# Patient Record
Sex: Female | Born: 1990 | Hispanic: No | Marital: Married | Smoking: Never smoker
Health system: Southern US, Community
[De-identification: ages and names within clinical notes are randomized; demographics above are authoritative.]

## PROBLEM LIST (undated history)

## (undated) HISTORY — PX: NO PAST SURGERIES: SHX2092

---

## 2017-08-25 NOTE — L&D Delivery Note (Signed)
Delivery Note Stephanie Ali is a 27 y.o. G1P0 at [redacted]w[redacted]d admitted for IOL d/t IUFD, u/s revealed 23wk fetal size.  Labor course: cytotec 200 po>400 po> 600 pv x 2 At 0745 a non-viable female was delivered via spontaneous vaginal delivery (Presentation: undetermined). Fetus and placenta came out together by RN, I entered room immediately after. APGAR: 0,0 ; weight: pending at time of note. 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  Intrapartum complications:  None Anesthesia:  fentanyl Episiotomy: none Lacerations:  none Suture Repair: n/a Est. Blood Loss (mL): 50 Sponge and instrument count: there was no table available  Mom to stay in L&D for observation, may go home in 4 hours if stable.  Baby: with mom Placenta to path  Cheral Marker CNM, WHNP-BC 05/05/2018 8:00 AM   Cheral Marker, CNM  P [facility]        Please schedule this patient for PP visit in: 2wk mental health check d/t IUFD  Low risk pregnancy complicated by: none  Delivery mode: SVD  Anticipated Birth Control: other/unsure  PP Procedures needed: none  Schedule Integrated BH visit: yes d/t IUFD  Provider: Any provider

## 2018-02-11 ENCOUNTER — Ambulatory Visit (INDEPENDENT_AMBULATORY_CARE_PROVIDER_SITE_OTHER): Payer: BLUE CROSS/BLUE SHIELD | Admitting: Obstetrics and Gynecology

## 2018-02-11 ENCOUNTER — Encounter: Payer: Self-pay | Admitting: Obstetrics and Gynecology

## 2018-02-11 ENCOUNTER — Other Ambulatory Visit (HOSPITAL_COMMUNITY)
Admission: RE | Admit: 2018-02-11 | Discharge: 2018-02-11 | Disposition: A | Discharge status: Home / Self Care | Payer: Medicaid Other | Source: Ambulatory Visit | Attending: Obstetrics and Gynecology | Admitting: Obstetrics and Gynecology

## 2018-02-11 VITALS — BP 108/70 | HR 77 | Ht 64.0 in | Wt 138.0 lb

## 2018-02-11 DIAGNOSIS — Z3400 Encounter for supervision of normal first pregnancy, unspecified trimester: Secondary | ICD-10-CM

## 2018-02-11 DIAGNOSIS — Z3401 Encounter for supervision of normal first pregnancy, first trimester: Secondary | ICD-10-CM | POA: Diagnosis not present

## 2018-02-11 DIAGNOSIS — Z3403 Encounter for supervision of normal first pregnancy, third trimester: Secondary | ICD-10-CM

## 2018-02-11 DIAGNOSIS — Z758 Other problems related to medical facilities and other health care: Secondary | ICD-10-CM | POA: Insufficient documentation

## 2018-02-11 DIAGNOSIS — Z789 Other specified health status: Secondary | ICD-10-CM | POA: Insufficient documentation

## 2018-02-11 MED ORDER — DOXYLAMINE-PYRIDOXINE 10-10 MG PO TBEC: 2.0000 | DELAYED_RELEASE_TABLET | Freq: Every day | ORAL | 5 refills | Status: DC

## 2018-02-11 NOTE — Patient Instructions (Signed)
 Second Trimester of Pregnancy The second trimester is from week 14 through week 27 (months 4 through 6). The second trimester is often a time when you feel your best. Your body has adjusted to being pregnant, and you begin to feel better physically. Usually, morning sickness has lessened or quit completely, you may have more energy, and you may have an increase in appetite. The second trimester is also a time when the fetus is growing rapidly. At the end of the sixth month, the fetus is about 9 inches long and weighs about 1 pounds. You will likely begin to feel the baby move (quickening) between 16 and 20 weeks of pregnancy. Body changes during your second trimester Your body continues to go through many changes during your second trimester. The changes vary from woman to woman.  Your weight will continue to increase. You will notice your lower abdomen bulging out.  You may begin to get stretch marks on your hips, abdomen, and breasts.  You may develop headaches that can be relieved by medicines. The medicines should be approved by your health care provider.  You may urinate more often because the fetus is pressing on your bladder.  You may develop or continue to have heartburn as a result of your pregnancy.  You may develop constipation because certain hormones are causing the muscles that push waste through your intestines to slow down.  You may develop hemorrhoids or swollen, bulging veins (varicose veins).  You may have back pain. This is caused by: ? Weight gain. ? Pregnancy hormones that are relaxing the joints in your pelvis. ? A shift in weight and the muscles that support your balance.  Your breasts will continue to grow and they will continue to become tender.  Your gums may bleed and may be sensitive to brushing and flossing.  Dark spots or blotches (chloasma, mask of pregnancy) may develop on your face. This will likely fade after the baby is born.  A dark line from  your belly button to the pubic area (linea nigra) may appear. This will likely fade after the baby is born.  You may have changes in your hair. These can include thickening of your hair, rapid growth, and changes in texture. Some women also have hair loss during or after pregnancy, or hair that feels dry or thin. Your hair will most likely return to normal after your baby is born.  What to expect at prenatal visits During a routine prenatal visit:  You will be weighed to make sure you and the fetus are growing normally.  Your blood pressure will be taken.  Your abdomen will be measured to track your baby's growth.  The fetal heartbeat will be listened to.  Any test results from the previous visit will be discussed.  Your health care provider may ask you:  How you are feeling.  If you are feeling the baby move.  If you have had any abnormal symptoms, such as leaking fluid, bleeding, severe headaches, or abdominal cramping.  If you are using any tobacco products, including cigarettes, chewing tobacco, and electronic cigarettes.  If you have any questions.  Other tests that may be performed during your second trimester include:  Blood tests that check for: ? Low iron levels (anemia). ? High blood sugar that affects pregnant women (gestational diabetes) between 24 and 28 weeks. ? Rh antibodies. This is to check for a protein on red blood cells (Rh factor).  Urine tests to check for infections, diabetes,   or protein in the urine.  An ultrasound to confirm the proper growth and development of the baby.  An amniocentesis to check for possible genetic problems.  Fetal screens for spina bifida and Down syndrome.  HIV (human immunodeficiency virus) testing. Routine prenatal testing includes screening for HIV, unless you choose not to have this test.  Follow these instructions at home: Medicines  Follow your health care provider's instructions regarding medicine use. Specific  medicines may be either safe or unsafe to take during pregnancy.  Take a prenatal vitamin that contains at least 600 micrograms (mcg) of folic acid.  If you develop constipation, try taking a stool softener if your health care provider approves. Eating and drinking  Eat a balanced diet that includes fresh fruits and vegetables, whole grains, good sources of protein such as meat, eggs, or tofu, and low-fat dairy. Your health care provider will help you determine the amount of weight gain that is right for you.  Avoid raw meat and uncooked cheese. These carry germs that can cause birth defects in the baby.  If you have low calcium intake from food, talk to your health care provider about whether you should take a daily calcium supplement.  Limit foods that are high in fat and processed sugars, such as fried and sweet foods.  To prevent constipation: ? Drink enough fluid to keep your urine clear or pale yellow. ? Eat foods that are high in fiber, such as fresh fruits and vegetables, whole grains, and beans. Activity  Exercise only as directed by your health care provider. Most women can continue their usual exercise routine during pregnancy. Try to exercise for 30 minutes at least 5 days a week. Stop exercising if you experience uterine contractions.  Avoid heavy lifting, wear low heel shoes, and practice good posture.  A sexual relationship may be continued unless your health care provider directs you otherwise. Relieving pain and discomfort  Wear a good support bra to prevent discomfort from breast tenderness.  Take warm sitz baths to soothe any pain or discomfort caused by hemorrhoids. Use hemorrhoid cream if your health care provider approves.  Rest with your legs elevated if you have leg cramps or low back pain.  If you develop varicose veins, wear support hose. Elevate your feet for 15 minutes, 3-4 times a day. Limit salt in your diet. Prenatal Care  Write down your questions.  Take them to your prenatal visits.  Keep all your prenatal visits as told by your health care provider. This is important. Safety  Wear your seat belt at all times when driving.  Make a list of emergency phone numbers, including numbers for family, friends, the hospital, and police and fire departments. General instructions  Ask your health care provider for a referral to a local prenatal education class. Begin classes no later than the beginning of month 6 of your pregnancy.  Ask for help if you have counseling or nutritional needs during pregnancy. Your health care provider can offer advice or refer you to specialists for help with various needs.  Do not use hot tubs, steam rooms, or saunas.  Do not douche or use tampons or scented sanitary pads.  Do not cross your legs for long periods of time.  Avoid cat litter boxes and soil used by cats. These carry germs that can cause birth defects in the baby and possibly loss of the fetus by miscarriage or stillbirth.  Avoid all smoking, herbs, alcohol, and unprescribed drugs. Chemicals in these products   can affect the formation and growth of the baby.  Do not use any products that contain nicotine or tobacco, such as cigarettes and e-cigarettes. If you need help quitting, ask your health care provider.  Visit your dentist if you have not gone yet during your pregnancy. Use a soft toothbrush to brush your teeth and be gentle when you floss. Contact a health care provider if:  You have dizziness.  You have mild pelvic cramps, pelvic pressure, or nagging pain in the abdominal area.  You have persistent nausea, vomiting, or diarrhea.  You have a bad smelling vaginal discharge.  You have pain when you urinate. Get help right away if:  You have a fever.  You are leaking fluid from your vagina.  You have spotting or bleeding from your vagina.  You have severe abdominal cramping or pain.  You have rapid weight gain or weight  loss.  You have shortness of breath with chest pain.  You notice sudden or extreme swelling of your face, hands, ankles, feet, or legs.  You have not felt your baby move in over an hour.  You have severe headaches that do not go away when you take medicine.  You have vision changes. Summary  The second trimester is from week 14 through week 27 (months 4 through 6). It is also a time when the fetus is growing rapidly.  Your body goes through many changes during pregnancy. The changes vary from woman to woman.  Avoid all smoking, herbs, alcohol, and unprescribed drugs. These chemicals affect the formation and growth your baby.  Do not use any tobacco products, such as cigarettes, chewing tobacco, and e-cigarettes. If you need help quitting, ask your health care provider.  Contact your health care provider if you have any questions. Keep all prenatal visits as told by your health care provider. This is important. This information is not intended to replace advice given to you by your health care provider. Make sure you discuss any questions you have with your health care provider. Document Released: 08/05/2001 Document Revised: 09/16/2016 Document Reviewed: 09/16/2016 [employer] Interactive Patient Education  2018 [employer]   Contraception Choices Contraception, also called birth control, refers to methods or devices that prevent pregnancy. Hormonal methods Contraceptive implant A contraceptive implant is a thin, plastic tube that contains a hormone. It is inserted into the upper part of the arm. It can remain in place for up to 3 years. Progestin-only injections Progestin-only injections are injections of progestin, a synthetic form of the hormone progesterone. They are given every 3 months by a health care provider. Birth control pills Birth control pills are pills that contain hormones that prevent pregnancy. They must be taken once a day, preferably at the same time each  day. Birth control patch The birth control patch contains hormones that prevent pregnancy. It is placed on the skin and must be changed once a week for three weeks and removed on the fourth week. A prescription is needed to use this method of contraception. Vaginal ring A vaginal ring contains hormones that prevent pregnancy. It is placed in the vagina for three weeks and removed on the fourth week. After that, the process is repeated with a new ring. A prescription is needed to use this method of contraception. Emergency contraceptive Emergency contraceptives prevent pregnancy after unprotected sex. They come in pill form and can be taken up to 5 days after sex. They work best the sooner they are taken after having sex. Most emergency   contraceptives are available without a prescription. This method should not be used as your only form of birth control. Barrier methods Female condom A female condom is a thin sheath that is worn over the penis during sex. Condoms keep sperm from going inside a woman's body. They can be used with a spermicide to increase their effectiveness. They should be disposed after a single use. Female condom A female condom is a soft, loose-fitting sheath that is put into the vagina before sex. The condom keeps sperm from going inside a woman's body. They should be disposed after a single use. Diaphragm A diaphragm is a soft, dome-shaped barrier. It is inserted into the vagina before sex, along with a spermicide. The diaphragm blocks sperm from entering the uterus, and the spermicide kills sperm. A diaphragm should be left in the vagina for 6-8 hours after sex and removed within 24 hours. A diaphragm is prescribed and fitted by a health care provider. A diaphragm should be replaced every 1-2 years, after giving birth, after gaining more than 15 lb (6.8 kg), and after pelvic surgery. Cervical cap A cervical cap is a round, soft latex or plastic cup that fits over the cervix. It is  inserted into the vagina before sex, along with spermicide. It blocks sperm from entering the uterus. The cap should be left in place for 6-8 hours after sex and removed within 48 hours. A cervical cap must be prescribed and fitted by a health care provider. It should be replaced every 2 years. Sponge A sponge is a soft, circular piece of polyurethane foam with spermicide on it. The sponge helps block sperm from entering the uterus, and the spermicide kills sperm. To use it, you make it wet and then insert it into the vagina. It should be inserted before sex, left in for at least 6 hours after sex, and removed and thrown away within 30 hours. Spermicides Spermicides are chemicals that kill or block sperm from entering the cervix and uterus. They can come as a cream, jelly, suppository, foam, or tablet. A spermicide should be inserted into the vagina with an applicator at least 10-15 minutes before sex to allow time for it to work. The process must be repeated every time you have sex. Spermicides do not require a prescription. Intrauterine contraception Intrauterine device (IUD) An IUD is a T-shaped device that is put in a woman's uterus. There are two types:  Hormone IUD.This type contains progestin, a synthetic form of the hormone progesterone. This type can stay in place for 3-5 years.  Copper IUD.This type is wrapped in copper wire. It can stay in place for 10 years.  Permanent methods of contraception Female tubal ligation In this method, a woman's fallopian tubes are sealed, tied, or blocked during surgery to prevent eggs from traveling to the uterus. Hysteroscopic sterilization In this method, a small, flexible insert is placed into each fallopian tube. The inserts cause scar tissue to form in the fallopian tubes and block them, so sperm cannot reach an egg. The procedure takes about 3 months to be effective. Another form of birth control must be used during those 3 months. Female  sterilization This is a procedure to tie off the tubes that carry sperm (vasectomy). After the procedure, the man can still ejaculate fluid (semen). Natural planning methods Natural family planning In this method, a couple does not have sex on days when the woman could become pregnant. Calendar method This means keeping track of the length of each   menstrual cycle, identifying the days when pregnancy can happen, and not having sex on those days. Ovulation method In this method, a couple avoids sex during ovulation. Symptothermal method This method involves not having sex during ovulation. The woman typically checks for ovulation by watching changes in her temperature and in the consistency of cervical mucus. Post-ovulation method In this method, a couple waits to have sex until after ovulation. Summary  Contraception, also called birth control, means methods or devices that prevent pregnancy.  Hormonal methods of contraception include implants, injections, pills, patches, vaginal rings, and emergency contraceptives.  Barrier methods of contraception can include female condoms, female condoms, diaphragms, cervical caps, sponges, and spermicides.  There are two types of IUDs (intrauterine devices). An IUD can be put in a woman's uterus to prevent pregnancy for 3-5 years.  Permanent sterilization can be done through a procedure for males, females, or both.  Natural family planning methods involve not having sex on days when the woman could become pregnant. This information is not intended to replace advice given to you by your health care provider. Make sure you discuss any questions you have with your health care provider. Document Released: 08/11/2005 Document Revised: 09/13/2016 Document Reviewed: 09/13/2016 [employer] Interactive Patient Education  2018 [employer]   Breastfeeding Choosing to breastfeed is one of the best decisions you can make for yourself and your baby. A change in  hormones during pregnancy causes your breasts to make breast milk in your milk-producing glands. Hormones prevent breast milk from being released before your baby is born. They also prompt milk flow after birth. Once breastfeeding has begun, thoughts of your baby, as well as his or her sucking or crying, can stimulate the release of milk from your milk-producing glands. Benefits of breastfeeding Research shows that breastfeeding offers many health benefits for infants and mothers. It also offers a cost-free and convenient way to feed your baby. For your baby  Your first milk (colostrum) helps your baby's digestive system to function better.  Special cells in your milk (antibodies) help your baby to fight off infections.  Breastfed babies are less likely to develop asthma, allergies, obesity, or type 2 diabetes. They are also at lower risk for sudden infant death syndrome (SIDS).  Nutrients in breast milk are better able to meet your baby's needs compared to infant formula.  Breast milk improves your baby's brain development. For you  Breastfeeding helps to create a very special bond between you and your baby.  Breastfeeding is convenient. Breast milk costs nothing and is always available at the correct temperature.  Breastfeeding helps to burn calories. It helps you to lose the weight that you gained during pregnancy.  Breastfeeding makes your uterus return faster to its size before pregnancy. It also slows bleeding (lochia) after you give birth.  Breastfeeding helps to lower your risk of developing type 2 diabetes, osteoporosis, rheumatoid arthritis, cardiovascular disease, and breast, ovarian, uterine, and endometrial cancer later in life. Breastfeeding basics Starting breastfeeding  Find a comfortable place to sit or lie down, with your neck and back well-supported.  Place a pillow or a rolled-up blanket under your baby to bring him or her to the level of your breast (if you are  seated). Nursing pillows are specially designed to help support your arms and your baby while you breastfeed.  Make sure that your baby's tummy (abdomen) is facing your abdomen.  Gently massage your breast. With your fingertips, massage from the outer edges of your breast inward   toward the nipple. This encourages milk flow. If your milk flows slowly, you may need to continue this action during the feeding.  Support your breast with 4 fingers underneath and your thumb above your nipple (make the letter "C" with your hand). Make sure your fingers are well away from your nipple and your baby's mouth.  Stroke your baby's lips gently with your finger or nipple.  When your baby's mouth is open wide enough, quickly bring your baby to your breast, placing your entire nipple and as much of the areola as possible into your baby's mouth. The areola is the colored area around your nipple. ? More areola should be visible above your baby's upper lip than below the lower lip. ? Your baby's lips should be opened and extended outward (flanged) to ensure an adequate, comfortable latch. ? Your baby's tongue should be between his or her lower gum and your breast.  Make sure that your baby's mouth is correctly positioned around your nipple (latched). Your baby's lips should create a seal on your breast and be turned out (everted).  It is common for your baby to suck about 2-3 minutes in order to start the flow of breast milk. Latching Teaching your baby how to latch onto your breast properly is very important. An improper latch can cause nipple pain, decreased milk supply, and poor weight gain in your baby. Also, if your baby is not latched onto your nipple properly, he or she may swallow some air during feeding. This can make your baby fussy. Burping your baby when you switch breasts during the feeding can help to get rid of the air. However, teaching your baby to latch on properly is still the best way to prevent  fussiness from swallowing air while breastfeeding. Signs that your baby has successfully latched onto your nipple  Silent tugging or silent sucking, without causing you pain. Infant's lips should be extended outward (flanged).  Swallowing heard between every 3-4 sucks once your milk has started to flow (after your let-down milk reflex occurs).  Muscle movement above and in front of his or her ears while sucking.  Signs that your baby has not successfully latched onto your nipple  Sucking sounds or smacking sounds from your baby while breastfeeding.  Nipple pain.  If you think your baby has not latched on correctly, slip your finger into the corner of your baby's mouth to break the suction and place it between your baby's gums. Attempt to start breastfeeding again. Signs of successful breastfeeding Signs from your baby  Your baby will gradually decrease the number of sucks or will completely stop sucking.  Your baby will fall asleep.  Your baby's body will relax.  Your baby will retain a small amount of milk in his or her mouth.  Your baby will let go of your breast by himself or herself.  Signs from you  Breasts that have increased in firmness, weight, and size 1-3 hours after feeding.  Breasts that are softer immediately after breastfeeding.  Increased milk volume, as well as a change in milk consistency and color by the fifth day of breastfeeding.  Nipples that are not sore, cracked, or bleeding.  Signs that your baby is getting enough milk  Wetting at least 1-2 diapers during the first 24 hours after birth.  Wetting at least 5-6 diapers every 24 hours for the first week after birth. The urine should be clear or pale yellow by the age of 5 days.  Wetting 6-8   diapers every 24 hours as your baby continues to grow and develop.  At least 3 stools in a 24-hour period by the age of 5 days. The stool should be soft and yellow.  At least 3 stools in a 24-hour period by the  age of 7 days. The stool should be seedy and yellow.  No loss of weight greater than 10% of birth weight during the first 3 days of life.  Average weight gain of 4-7 oz (113-198 g) per week after the age of 4 days.  Consistent daily weight gain by the age of 5 days, without weight loss after the age of 2 weeks. After a feeding, your baby may spit up a small amount of milk. This is normal. Breastfeeding frequency and duration Frequent feeding will help you make more milk and can prevent sore nipples and extremely full breasts (breast engorgement). Breastfeed when you feel the need to reduce the fullness of your breasts or when your baby shows signs of hunger. This is called "breastfeeding on demand." Signs that your baby is hungry include:  Increased alertness, activity, or restlessness.  Movement of the head from side to side.  Opening of the mouth when the corner of the mouth or cheek is stroked (rooting).  Increased sucking sounds, smacking lips, cooing, sighing, or squeaking.  Hand-to-mouth movements and sucking on fingers or hands.  Fussing or crying.  Avoid introducing a pacifier to your baby in the first 4-6 weeks after your baby is born. After this time, you may choose to use a pacifier. Research has shown that pacifier use during the first year of a baby's life decreases the risk of sudden infant death syndrome (SIDS). Allow your baby to feed on each breast as long as he or she wants. When your baby unlatches or falls asleep while feeding from the first breast, offer the second breast. Because newborns are often sleepy in the first few weeks of life, you may need to awaken your baby to get him or her to feed. Breastfeeding times will vary from baby to baby. However, the following rules can serve as a guide to help you make sure that your baby is properly fed:  Newborns (babies 4 weeks of age or younger) may breastfeed every 1-3 hours.  Newborns should not go without breastfeeding  for longer than 3 hours during the day or 5 hours during the night.  You should breastfeed your baby a minimum of 8 times in a 24-hour period.  Breast milk pumping Pumping and storing breast milk allows you to make sure that your baby is exclusively fed your breast milk, even at times when you are unable to breastfeed. This is especially important if you go back to work while you are still breastfeeding, or if you are not able to be present during feedings. Your lactation consultant can help you find a method of pumping that works best for you and give you guidelines about how long it is safe to store breast milk. Caring for your breasts while you breastfeed Nipples can become dry, cracked, and sore while breastfeeding. The following recommendations can help keep your breasts moisturized and healthy:  Avoid using soap on your nipples.  Wear a supportive bra designed especially for nursing. Avoid wearing underwire-style bras or extremely tight bras (sports bras).  Air-dry your nipples for 3-4 minutes after each feeding.  Use only cotton bra pads to absorb leaked breast milk. Leaking of breast milk between feedings is normal.  Use lanolin   on your nipples after breastfeeding. Lanolin helps to maintain your skin's normal moisture barrier. Pure lanolin is not harmful (not toxic) to your baby. You may also hand express a few drops of breast milk and gently massage that milk into your nipples and allow the milk to air-dry.  In the first few weeks after giving birth, some women experience breast engorgement. Engorgement can make your breasts feel heavy, warm, and tender to the touch. Engorgement peaks within 3-5 days after you give birth. The following recommendations can help to ease engorgement:  Completely empty your breasts while breastfeeding or pumping. You may want to start by applying warm, moist heat (in the shower or with warm, water-soaked hand towels) just before feeding or pumping. This  increases circulation and helps the milk flow. If your baby does not completely empty your breasts while breastfeeding, pump any extra milk after he or she is finished.  Apply ice packs to your breasts immediately after breastfeeding or pumping, unless this is too uncomfortable for you. To do this: ? Put ice in a plastic bag. ? Place a towel between your skin and the bag. ? Leave the ice on for 20 minutes, 2-3 times a day.  Make sure that your baby is latched on and positioned properly while breastfeeding.  If engorgement persists after 48 hours of following these recommendations, contact your health care provider or a lactation consultant. Overall health care recommendations while breastfeeding  Eat 3 healthy meals and 3 snacks every day. Well-nourished mothers who are breastfeeding need an additional 450-500 calories a day. You can meet this requirement by increasing the amount of a balanced diet that you eat.  Drink enough water to keep your urine pale yellow or clear.  Rest often, relax, and continue to take your prenatal vitamins to prevent fatigue, stress, and low vitamin and mineral levels in your body (nutrient deficiencies).  Do not use any products that contain nicotine or tobacco, such as cigarettes and e-cigarettes. Your baby may be harmed by chemicals from cigarettes that pass into breast milk and exposure to secondhand smoke. If you need help quitting, ask your health care provider.  Avoid alcohol.  Do not use illegal drugs or marijuana.  Talk with your health care provider before taking any medicines. These include over-the-counter and prescription medicines as well as vitamins and herbal supplements. Some medicines that may be harmful to your baby can pass through breast milk.  It is possible to become pregnant while breastfeeding. If birth control is desired, ask your health care provider about options that will be safe while breastfeeding your baby. Where to find more  information: [employer]: www.llli.org Contact a health care provider if:  You feel like you want to stop breastfeeding or have become frustrated with breastfeeding.  Your nipples are cracked or bleeding.  Your breasts are red, tender, or warm.  You have: ? Painful breasts or nipples. ? A swollen area on either breast. ? A fever or chills. ? Nausea or vomiting. ? Drainage other than breast milk from your nipples.  Your breasts do not become full before feedings by the fifth day after you give birth.  You feel sad and depressed.  Your baby is: ? Too sleepy to eat well. ? Having trouble sleeping. ? More than 1 week old and wetting fewer than 6 diapers in a 24-hour period. ? Not gaining weight by 5 days of age.  Your baby has fewer than 3 stools in a 24-hour period.    Your baby's skin or the white parts of his or her eyes become yellow. Get help right away if:  Your baby is overly tired (lethargic) and does not want to wake up and feed.  Your baby develops an unexplained fever. Summary  Breastfeeding offers many health benefits for infant and mothers.  Try to breastfeed your infant when he or she shows early signs of hunger.  Gently tickle or stroke your baby's lips with your finger or nipple to allow the baby to open his or her mouth. Bring the baby to your breast. Make sure that much of the areola is in your baby's mouth. Offer one side and burp the baby before you offer the other side.  Talk with your health care provider or lactation consultant if you have questions or you face problems as you breastfeed. This information is not intended to replace advice given to you by your health care provider. Make sure you discuss any questions you have with your health care provider. Document Released: 08/11/2005 Document Revised: 09/12/2016 Document Reviewed: 09/12/2016 Elsevier Interactive Patient Education  2018 Elsevier Inc.  

## 2018-02-11 NOTE — Progress Notes (Signed)
  Subjective:    Stephanie Ali is a G1P0 6150w3d being seen today for her first obstetrical visit.  Her obstetrical history is significant for first pregnancy. Patient does intend to breast feed. Pregnancy history fully reviewed.  Patient reports no complaints.  Vitals:   02/11/18 0926 02/11/18 0940  BP: 108/70   Pulse: 77   Weight: 138 lb (62.6 kg)   Height:  5\' 4"  (1.626 m)    HISTORY: OB History  Gravida Para Term Preterm AB Living  1            SAB TAB Ectopic Multiple Live Births               # Outcome Date GA Lbr Len/2nd Weight Sex Delivery Anes PTL Lv  1 Current            History reviewed. No pertinent past medical history. History reviewed. No pertinent surgical history. History reviewed. No pertinent family history.   Exam    Uterus:   12-week size  Pelvic Exam:    Perineum: No Hemorrhoids, Normal Perineum   Vulva: normal   Vagina:  normal mucosa, normal discharge   pH:    Cervix: nulliparous appearance, closed and long   Adnexa: normal adnexa and no mass, fullness, tenderness   Bony Pelvis: gynecoid  System: Breast:  normal appearance, no masses or tenderness   Skin: normal coloration and turgor, no rashes    Neurologic: oriented, no focal deficits   Extremities: normal strength, tone, and muscle mass   HEENT extra ocular movement intact   Mouth/Teeth mucous membranes moist, pharynx normal without lesions and dental hygiene good   Neck supple and no masses   Cardiovascular: regular rate and rhythm   Respiratory:  chest clear, no wheezing, crepitations, rhonchi, normal symmetric air entry   Abdomen: soft, non-tender; bowel sounds normal; no masses,  no organomegaly   Urinary:       Assessment:    Pregnancy: G1P0 Patient Active Problem List   Diagnosis Date Noted  . Supervision of normal first pregnancy, antepartum 02/11/2018  . Language barrier 02/11/2018        Plan:     Initial labs drawn. Prenatal vitamins. Problem list reviewed and  updated. Genetic Screening discussed : requested panorama  Ultrasound discussed; fetal survey: requested.  Follow up in 4 weeks. 50% of 30 min visit spent on counseling and coordination of care.     Stephanie Ali 02/11/2018

## 2018-02-11 NOTE — Progress Notes (Signed)
[employer] interpreter Irven Coealad URTA

## 2018-02-13 LAB — URINE CULTURE, OB REFLEX: Organism ID, Bacteria: NO GROWTH

## 2018-02-13 LAB — CULTURE, OB URINE

## 2018-02-15 LAB — CYTOLOGY - PAP
CHLAMYDIA, DNA PROBE: NEGATIVE
Diagnosis: NEGATIVE
NEISSERIA GONORRHEA: NEGATIVE

## 2018-02-19 LAB — OBSTETRIC PANEL, INCLUDING HIV
Antibody Screen: NEGATIVE
Basophils Absolute: 0 10*3/uL (ref 0.0–0.2)
Basos: 0 %
EOS (ABSOLUTE): 0 10*3/uL (ref 0.0–0.4)
EOS: 1 %
HEMATOCRIT: 36.2 % (ref 34.0–46.6)
HEMOGLOBIN: 12.2 g/dL (ref 11.1–15.9)
HEP B S AG: NEGATIVE
HIV Screen 4th Generation wRfx: NONREACTIVE
IMMATURE GRANS (ABS): 0 10*3/uL (ref 0.0–0.1)
IMMATURE GRANULOCYTES: 0 %
LYMPHS: 28 %
Lymphocytes Absolute: 1.4 10*3/uL (ref 0.7–3.1)
MCH: 28.8 pg (ref 26.6–33.0)
MCHC: 33.7 g/dL (ref 31.5–35.7)
MCV: 86 fL (ref 79–97)
MONOS ABS: 0.4 10*3/uL (ref 0.1–0.9)
Monocytes: 8 %
Neutrophils Absolute: 3.1 10*3/uL (ref 1.4–7.0)
Neutrophils: 63 %
Platelets: 163 10*3/uL (ref 150–450)
RBC: 4.23 x10E6/uL (ref 3.77–5.28)
RDW: 14.7 % (ref 12.3–15.4)
RH TYPE: POSITIVE
RPR Ser Ql: NONREACTIVE
Rubella Antibodies, IGG: 11.6 index (ref >0.99)
WBC: 4.9 10*3/uL (ref 3.4–10.8)

## 2018-02-19 LAB — SMN1 COPY NUMBER ANALYSIS (SMA CARRIER SCREENING)

## 2018-02-19 LAB — HEMOGLOBINOPATHY EVALUATION
HGB C: 0 %
HGB S: 0 %
HGB VARIANT: 0 %
Hemoglobin A2 Quantitation: 2.5 % (ref 1.8–3.2)
Hemoglobin F Quantitation: 0 % (ref 0.0–2.0)
Hgb A: 97.5 % (ref 96.4–98.8)

## 2018-02-19 LAB — CYSTIC FIBROSIS MUTATION 97: GENE DIS ANAL CARRIER INTERP BLD/T-IMP: NOT DETECTED

## 2018-02-19 NOTE — Progress Notes (Unsigned)
Panorama results scanned. Armandina StammerJennifer Howard RN

## 2018-03-16 ENCOUNTER — Ambulatory Visit (INDEPENDENT_AMBULATORY_CARE_PROVIDER_SITE_OTHER): Payer: Medicaid Other | Admitting: Advanced Practice Midwife

## 2018-03-16 ENCOUNTER — Encounter: Payer: Medicaid Other | Admitting: Advanced Practice Midwife

## 2018-03-16 ENCOUNTER — Encounter: Payer: Self-pay | Admitting: Advanced Practice Midwife

## 2018-03-16 VITALS — BP 120/86 | HR 93 | Wt 139.0 lb

## 2018-03-16 DIAGNOSIS — Z3490 Encounter for supervision of normal pregnancy, unspecified, unspecified trimester: Secondary | ICD-10-CM

## 2018-03-16 DIAGNOSIS — Z3400 Encounter for supervision of normal first pregnancy, unspecified trimester: Secondary | ICD-10-CM

## 2018-03-16 DIAGNOSIS — Z3402 Encounter for supervision of normal first pregnancy, second trimester: Secondary | ICD-10-CM

## 2018-03-16 MED ORDER — NATACHEW 28-1 MG PO CHEW: 1.0000 | CHEWABLE_TABLET | Freq: Every day | ORAL | 8 refills | Status: DC

## 2018-03-16 NOTE — Progress Notes (Signed)
   PRENATAL VISIT NOTE  Subjective:  Stephanie Ali is a 27 y.o. G1P0 at 6231w1d being seen today for ongoing prenatal care.  She is currently monitored for the following issues for this low-risk pregnancy and has Supervision of normal first pregnancy, antepartum and Language barrier on their problem list.  Patient reports no complaints.  Contractions: Not present. Vag. Bleeding: None.  Movement: Present. Denies leaking of fluid.   The following portions of the patient's history were reviewed and updated as appropriate: allergies, current medications, past family history, past medical history, past social history, past surgical history and problem list. Problem list updated.  Objective:   Vitals:   03/16/18 1047  BP: 120/86  Pulse: 93  Weight: 139 lb (63 kg)    Fetal Status:   Fundal Height: 18 cm Movement: Present     General:  Alert, oriented and cooperative. Patient is in no acute distress.  Skin: Skin is warm and dry. No rash noted.   Cardiovascular: Normal heart rate noted  Respiratory: Normal respiratory effort, no problems with respiration noted  Abdomen: Soft, gravid, appropriate for gestational age.  Pain/Pressure: Present     Pelvic: Cervical exam deferred        Extremities: Normal range of motion.  Edema: None  Mental Status: Normal mood and affect. Normal behavior. Normal judgment and thought content.   Assessment and Plan:  Pregnancy: G1P0 at 5531w1d  1. Prenatal care, antepartum     Reviewed normal panorama results     Does not wish to know gender     Has friend with her today translating  - US MFM OB COMP + 14 WK; Future  2. Supervision of normal first pregnancy, antepartum       Reviewed warning signs and normal fetal development   Preterm labor symptoms and general obstetric precautions including but not limited to vaginal bleeding, contractions, leaking of fluid and fetal movement were reviewed in detail with the patient. Please refer to After Visit Summary for  other counseling recommendations.  [facility] (around [facility]13[facility]2018[facility]  Future Appointments  Date Time Provider Department Center  04/01/2018  8:15 AM [facility] US 4 [facility]  04/14/2018  1:15 PM Willodean RosenthalHarraway-Smith, Carolyn, MD [facility] None    Wynelle BourgeoisMarie Christon Parada, CNM

## 2018-03-16 NOTE — Patient Instructions (Signed)
Second Trimester of Pregnancy The second trimester is from week 13 through week 28, month 4 through 6. This is often the time in pregnancy that you feel your best. Often times, morning sickness has lessened or quit. You may have more energy, and you may get hungry more often. Your unborn baby (fetus) is growing rapidly. At the end of the sixth month, he or she is about 9 inches long and weighs about 1 pounds. You will likely feel the baby move (quickening) between 18 and 20 weeks of pregnancy. Follow these instructions at home:  Avoid all smoking, herbs, and alcohol. Avoid drugs not approved by your doctor.  Do not use any tobacco products, including cigarettes, chewing tobacco, and electronic cigarettes. If you need help quitting, ask your doctor. You may get counseling or other support to help you quit.  Only take medicine as told by your doctor. Some medicines are safe and some are not during pregnancy.  Exercise only as told by your doctor. Stop exercising if you start having cramps.  Eat regular, healthy meals.  Wear a good support bra if your breasts are tender.  Do not use hot tubs, steam rooms, or saunas.  Wear your seat belt when driving.  Avoid raw meat, uncooked cheese, and liter boxes and soil used by cats.  Take your prenatal vitamins.  Take 1500-2000 milligrams of calcium daily starting at the 20th week of pregnancy until you deliver your baby.  Try taking medicine that helps you poop (stool softener) as needed, and if your doctor approves. Eat more fiber by eating fresh fruit, vegetables, and whole grains. Drink enough fluids to keep your pee (urine) clear or pale yellow.  Take warm water baths (sitz baths) to soothe pain or discomfort caused by hemorrhoids. Use hemorrhoid cream if your doctor approves.  If you have puffy, bulging veins (varicose veins), wear support hose. Raise (elevate) your feet for 15 minutes, 3-4 times a day. Limit salt in your diet.  Avoid heavy  lifting, wear low heals, and sit up straight.  Rest with your legs raised if you have leg cramps or low back pain.  Visit your dentist if you have not gone during your pregnancy. Use a soft toothbrush to brush your teeth. Be gentle when you floss.  You can have sex (intercourse) unless your doctor tells you not to.  Go to your doctor visits. Get help if:  You feel dizzy.  You have mild cramps or pressure in your lower belly (abdomen).  You have a nagging pain in your belly area.  You continue to feel sick to your stomach (nauseous), throw up (vomit), or have watery poop (diarrhea).  You have bad smelling fluid coming from your vagina.  You have pain with peeing (urination). Get help right away if:  You have a fever.  You are leaking fluid from your vagina.  You have spotting or bleeding from your vagina.  You have severe belly cramping or pain.  You lose or gain weight rapidly.  You have trouble catching your breath and have chest pain.  You notice sudden or extreme puffiness (swelling) of your face, hands, ankles, feet, or legs.  You have not felt the baby move in over an hour.  You have severe headaches that do not go away with medicine.  You have vision changes. This information is not intended to replace advice given to you by your health care provider. Make sure you discuss any questions you have with your health care   provider. Document Released: 11/05/2009 Document Revised: 01/17/2016 Document Reviewed: 10/12/2012 Elsevier Interactive Patient Education  2017 Elsevier Inc.  

## 2018-03-16 NOTE — Progress Notes (Deleted)
   PRENATAL VISIT NOTE  Subjective:  Stephanie Ali is a 27 y.o. G1P0 at 5232w1d being seen today for ongoing prenatal care.  She is currently monitored for the following issues for this {Blank single:19197::"high-risk","low-risk"} pregnancy and has Supervision of normal first pregnancy, antepartum and Language barrier on their problem list.  Patient reports {sx:14538}.  Contractions: Not present. Vag. Bleeding: None.  Movement: Present. Denies leaking of fluid.   The following portions of the patient's history were reviewed and updated as appropriate: allergies, current medications, past family history, past medical history, past social history, past surgical history and problem list. Problem list updated.  Objective:   Vitals:   03/16/18 1047  BP: 120/86  Pulse: 93  Weight: 139 lb (63 kg)    Fetal Status:   Fundal Height: 18 cm Movement: Present     General:  Alert, oriented and cooperative. Patient is in no acute distress.  Skin: Skin is warm and dry. No rash noted.   Cardiovascular: Normal heart rate noted  Respiratory: Normal respiratory effort, no problems with respiration noted  Abdomen: Soft, gravid, appropriate for gestational age.  Pain/Pressure: Present     Pelvic: {Blank single:19197::"Cervical exam performed","Cervical exam deferred"}        Extremities: Normal range of motion.  Edema: None  Mental Status: Normal mood and affect. Normal behavior. Normal judgment and thought content.   Assessment and Plan:  Pregnancy: G1P0 at 5332w1d  1. Prenatal care, antepartum *** - US MFM OB COMP + 14 WK; Future  2. Supervision of normal first pregnancy, antepartum ***  {Blank single:19197::"Term","Preterm"} labor symptoms and general obstetric precautions including but not limited to vaginal bleeding, contractions, leaking of fluid and fetal movement were reviewed in detail with the patient. Please refer to After Visit Summary for other counseling recommendations.  Return in about 1  month (around 04/13/2018[facility]  Future Appointments  Date Time Provider Department Center  04/01/2018  8:15 AM [facility] US 4 [facility]  04/14/2018  1:15 PM Willodean RosenthalHarraway-Smith, Carolyn, MD [facility] None    Wynelle BourgeoisMarie Betzaida Cremeens, CNM

## 2018-03-16 NOTE — Progress Notes (Incomplete)
   PRENATAL VISIT NOTE  Subjective:  Stephanie Ali is a 27 y.o. G1P0 at [redacted]w[redacted]d being seen today for ongoing prenatal care.  She is currently monitored for the following issues for this {Blank single:19197::"high-risk","low-risk"} pregnancy and has Supervision of normal first pregnancy, antepartum and Language barrier on their problem list.  Patient reports {sx:14538}.  Contractions: Not present. Vag. Bleeding: None.  Movement: Present. Denies leaking of fluid.   The following portions of the patient's history were reviewed and updated as appropriate: allergies, current medications, past family history, past medical history, past social history, past surgical history and problem list. Problem list updated.  Objective:   Vitals:   03/16/18 1047  BP: 120/86  Pulse: 93  Weight: 139 lb (63 kg)    Fetal Status:   Fundal Height: 18 cm Movement: Present     General:  Alert, oriented and cooperative. Patient is in no acute distress.  Skin: Skin is warm and dry. No rash noted.   Cardiovascular: Normal heart rate noted  Respiratory: Normal respiratory effort, no problems with respiration noted  Abdomen: Soft, gravid, appropriate for gestational age.  Pain/Pressure: Present     Pelvic: {Blank single:19197::"Cervical exam performed","Cervical exam deferred"}        Extremities: Normal range of motion.  Edema: None  Mental Status: Normal mood and affect. Normal behavior. Normal judgment and thought content.   Assessment and Plan:  Pregnancy: G1P0 at [redacted]w[redacted]d  1. Prenatal care, antepartum *** - US MFM OB COMP + 14 WK; Future  2. Supervision of normal first pregnancy, antepartum ***  {Blank single:19197::"Term","Preterm"} labor symptoms and general obstetric precautions including but not limited to vaginal bleeding, contractions, leaking of fluid and fetal movement were reviewed in detail with the patient. Please refer to After Visit Summary for other counseling recommendations.  Return in about 1  month (around 04/13/2018) for [facility].  Future Appointments  Date Time Provider Department Center  04/01/2018  8:15 AM [facility] US 4 [facility]  04/14/2018  1:15 PM Harraway-Smith, Carolyn, MD [facility] None    Marie Williams, CNM 

## 2018-03-25 ENCOUNTER — Encounter (HOSPITAL_COMMUNITY): Payer: Self-pay

## 2018-04-01 ENCOUNTER — Other Ambulatory Visit (HOSPITAL_COMMUNITY): Payer: Self-pay | Admitting: *Deleted

## 2018-04-01 ENCOUNTER — Other Ambulatory Visit: Payer: Self-pay | Admitting: Advanced Practice Midwife

## 2018-04-01 ENCOUNTER — Ambulatory Visit (HOSPITAL_COMMUNITY)
Admission: RE | Admit: 2018-04-01 | Discharge: 2018-04-01 | Disposition: A | Discharge status: Home / Self Care | Payer: Medicaid Other | Source: Ambulatory Visit | Attending: Advanced Practice Midwife | Admitting: Advanced Practice Midwife

## 2018-04-01 DIAGNOSIS — Z363 Encounter for antenatal screening for malformations: Secondary | ICD-10-CM | POA: Diagnosis not present

## 2018-04-01 DIAGNOSIS — Z3490 Encounter for supervision of normal pregnancy, unspecified, unspecified trimester: Secondary | ICD-10-CM

## 2018-04-01 DIAGNOSIS — Z362 Encounter for other antenatal screening follow-up: Secondary | ICD-10-CM

## 2018-04-01 DIAGNOSIS — Z3A20 20 weeks gestation of pregnancy: Secondary | ICD-10-CM

## 2018-04-14 ENCOUNTER — Ambulatory Visit (INDEPENDENT_AMBULATORY_CARE_PROVIDER_SITE_OTHER): Payer: Medicaid Other | Admitting: Obstetrics & Gynecology

## 2018-04-14 VITALS — BP 129/61 | HR 89 | Wt 140.0 lb

## 2018-04-14 DIAGNOSIS — Z789 Other specified health status: Secondary | ICD-10-CM

## 2018-04-14 DIAGNOSIS — R12 Heartburn: Secondary | ICD-10-CM

## 2018-04-14 DIAGNOSIS — Z3400 Encounter for supervision of normal first pregnancy, unspecified trimester: Secondary | ICD-10-CM

## 2018-04-14 DIAGNOSIS — O26899 Other specified pregnancy related conditions, unspecified trimester: Secondary | ICD-10-CM

## 2018-04-14 MED ORDER — RANITIDINE HCL 75 MG PO TABS: 150.0000 mg | ORAL_TABLET | Freq: Two times a day (BID) | ORAL | 1 refills | Status: DC

## 2018-04-14 NOTE — Progress Notes (Signed)
   PRENATAL VISIT NOTE  Subjective:  Stephanie Ali is a 27 y.o. G1P0 at 7120w2d being seen today for ongoing prenatal care.  She is currently monitored for the following issues for this low-risk pregnancy and has Supervision of normal first pregnancy, antepartum and Language barrier on their problem list.  Patient reports heartburn.  Contractions: Not present. Vag. Bleeding: None.   . Denies leaking of fluid.   The following portions of the patient's history were reviewed and updated as appropriate: allergies, current medications, past family history, past medical history, past social history, past surgical history and problem list. Problem list updated.  Objective:   Vitals:   04/14/18 1326  BP: 129/61  Pulse: 89  Weight: 140 lb (63.5 kg)    Fetal Status:           General:  Alert, oriented and cooperative. Patient is in no acute distress.  Skin: Skin is warm and dry. No rash noted.   Cardiovascular: Normal heart rate noted  Respiratory: Normal respiratory effort, no problems with respiration noted  Abdomen: Soft, gravid, appropriate for gestational age.  Pain/Pressure: Present     Pelvic: Cervical exam deferred        Extremities: Normal range of motion.  Edema: None  Mental Status: Normal mood and affect. Normal behavior. Normal judgment and thought content.   Assessment and Plan:  Pregnancy: G1P0 at 5620w2d  1. Supervision of normal first pregnancy, antepartum Needs a f/u anatomy scan  May take Zantac OTC prn  2. Language barrier Refused interpreter  Preterm labor symptoms and general obstetric precautions including but not limited to vaginal bleeding, contractions, leaking of fluid and fetal movement were reviewed in detail with the patient. Please refer to After Visit Summary for other counseling recommendations.  Return in about 4 weeks (around 05/12/2018).  Future Appointments  Date Time Provider Department Center  05/03/2018  8:30 AM [facility] US 1 [facility]     Willodean Rosenthalarolyn Harraway-Smith, MD

## 2018-04-14 NOTE — Progress Notes (Signed)
Patient refused interpretive services.Patient request that her friend translates anything she does not understand. Patient states she understands some [place]. Armandina StammerJennifer Howard RN

## 2018-04-14 NOTE — Patient Instructions (Signed)
Heartburn During Pregnancy Heartburn is pain or discomfort in the throat or chest. It may cause a burning feeling. It happens when stomach acid moves up into the tube that carries food from your mouth to your stomach (esophagus). Heartburn is common during pregnancy. It usually goes away or gets better after giving birth. Follow these instructions at home: Eating and drinking  Do not drink alcohol while you are pregnant.  Figure out which foods and beverages make you feel worse, and avoid them.  Beverages that you may want to avoid include: ? Coffee and tea (with or without caffeine). ? Energy drinks and sports drinks. ? Bubbly (carbonated) drinks or sodas. ? Citrus fruit juices.  Foods that you may want to avoid include: ? Chocolate and cocoa. ? Peppermint and mint flavorings. ? Garlic, onions, and horseradish. ? Spicy and acidic foods. These include peppers, chili powder, curry powder, vinegar, hot sauces, and barbecue sauce. ? Citrus fruits, such as oranges, lemons, and limes. ? Tomato-based foods, such as red sauce, chili, and salsa. ? Fried and fatty foods, such as donuts, french fries, potato chips, and high-fat dressings. ? High-fat meats, such as hot dogs, cold cuts, sausage, ham, and bacon. ? High-fat dairy items, such as whole milk, butter, and cheese.  Eat small meals often, instead of large meals.  Avoid drinking a lot of liquid with your meals.  Avoid eating meals during the 2-3 hours before you go to bed.  Avoid lying down right after you eat.  Do not exercise right after you eat. Medicines  Take over-the-counter and prescription medicines only as told by your doctor.  Do not take aspirin, ibuprofen, or other NSAIDs unless your doctor tells you to do that.  Your doctor may tell you to avoid medicines that have sodium bicarbonate in them. General instructions  If told, raise the head of your bed about 6 inches (15 cm). You can do this by putting blocks under  the legs. Sleeping with more pillows does not help with heartburn.  Do not use any products that contain nicotine or tobacco, such as cigarettes and e-cigarettes. If you need help quitting, ask your doctor.  Wear loose-fitting clothing.  Try to lower your stress, such as with yoga or meditation. If you need help, ask your doctor.  Stay at a healthy weight. If you are overweight, work with your doctor to safely lose weight.  Keep all follow-up visits as told by your doctor. This is important. Contact a doctor if:  You get new symptoms.  Your symptoms do not get better with treatment.  You have weight loss and you do not know why.  You have trouble swallowing.  You make loud sounds when you breathe (wheeze).  You have a cough that does not go away.  You have heartburn often for more than 2 weeks.  You feel sick to your stomach (nauseous), and this does not get better with treatment.  You are throwing up (vomiting), and this does not get better with treatment.  You have pain in your belly (abdomen). Get help right away if:  You have very bad chest pain that spreads to your arm, neck, or jaw.  You feel sweaty, dizzy, or light-headed.  You have trouble breathing.  You have pain when swallowing.  You throw up and your throw-up looks like blood or coffee grounds.  Your poop (stool) is bloody or black. This information is not intended to replace advice given to you by your health care provider.   Make sure you discuss any questions you have with your health care provider. Document Released: 09/13/2010 Document Revised: 04/28/2016 Document Reviewed: 04/28/2016 Elsevier Interactive Patient Education  2017 Elsevier Inc.  

## 2018-05-03 ENCOUNTER — Ambulatory Visit (INDEPENDENT_AMBULATORY_CARE_PROVIDER_SITE_OTHER): Payer: Medicaid Other | Admitting: Family Medicine

## 2018-05-03 ENCOUNTER — Ambulatory Visit (HOSPITAL_BASED_OUTPATIENT_CLINIC_OR_DEPARTMENT_OTHER)
Admission: RE | Admit: 2018-05-03 | Discharge: 2018-05-03 | Disposition: A | Discharge status: Home / Self Care | Payer: Medicaid Other | Source: Ambulatory Visit | Attending: Obstetrics & Gynecology | Admitting: Obstetrics & Gynecology

## 2018-05-03 VITALS — BP 140/80 | HR 91 | Wt 140.0 lb

## 2018-05-03 DIAGNOSIS — O364XX1 Maternal care for intrauterine death, fetus 1: Secondary | ICD-10-CM

## 2018-05-03 DIAGNOSIS — Z3A25 25 weeks gestation of pregnancy: Secondary | ICD-10-CM

## 2018-05-03 DIAGNOSIS — Z362 Encounter for other antenatal screening follow-up: Secondary | ICD-10-CM | POA: Diagnosis not present

## 2018-05-03 DIAGNOSIS — O364XX0 Maternal care for intrauterine death, not applicable or unspecified: Secondary | ICD-10-CM

## 2018-05-03 NOTE — Progress Notes (Signed)
Patient reports no movement for one week. Armandina Stammer RN

## 2018-05-03 NOTE — Progress Notes (Signed)
   PRENATAL VISIT NOTE  Subjective:  Stephanie Ali is a 27 y.o. G1P0 at [redacted]w[redacted]d being seen today for ongoing prenatal care.  She is currently monitored for the following issues for this low-risk pregnancy and has Supervision of normal first pregnancy, antepartum and Language barrier on their problem list.  Patient reports no fetal movement x1 week.  Contractions: Not present. Vag. Bleeding: None.  Movement: Absent. Denies leaking of fluid.   The following portions of the patient's history were reviewed and updated as appropriate: allergies, current medications, past family history, past medical history, past social history, past surgical history and problem list. Problem list updated.  Objective:   Vitals:   05/03/18 0944  BP: 140/80  Pulse: 91  Weight: 140 lb (63.5 kg)    Fetal Status:     Movement: Absent     General:  Alert, oriented and cooperative. Patient is in no acute distress.  Skin: Skin is warm and dry. No rash noted.   Cardiovascular: Normal heart rate noted  Respiratory: Normal respiratory effort, no problems with respiration noted  Abdomen: Soft, gravid, appropriate for gestational age.  Pain/Pressure: Present     Pelvic: Cervical exam deferred        Extremities: Normal range of motion.     Mental Status: Normal mood and affect. Normal behavior. Normal judgment and thought content.   Assessment and Plan:  Pregnancy: G1P0 at [redacted]w[redacted]d  1. IUFD at 20 weeks or more of gestation dx'd on [place] at MFM. Pt counseled in her language by Dr Judeth Cornfield. Pt requested that I check - US done by Victorino Dike, Charity fundraiser. No cardiac activity. Pt to discuss with husband, who is in [place], and come up with plan.  Preterm labor symptoms and general obstetric precautions including but not limited to vaginal bleeding, contractions, leaking of fluid and fetal movement were reviewed in detail with the patient. Please refer to After Visit Summary for other counseling recommendations.  No follow-ups on  file.  Future Appointments  Date Time Provider Department Center  05/12/2018  3:00 PM Willodean Rosenthal, MD [facility] None    Levie Heritage, DO

## 2018-05-04 ENCOUNTER — Encounter (HOSPITAL_COMMUNITY): Payer: Self-pay

## 2018-05-04 ENCOUNTER — Inpatient Hospital Stay (HOSPITAL_COMMUNITY)
Admission: AD | Admit: 2018-05-04 | Discharge: 2018-05-06 | DRG: 807 | Disposition: A | Discharge status: Home / Self Care | Payer: Medicaid Other | Attending: Obstetrics and Gynecology | Admitting: Obstetrics and Gynecology

## 2018-05-04 ENCOUNTER — Inpatient Hospital Stay (HOSPITAL_COMMUNITY): Admission: RE | Admit: 2018-05-04 | Payer: Medicaid Other | Source: Ambulatory Visit

## 2018-05-04 DIAGNOSIS — O364XX0 Maternal care for intrauterine death, not applicable or unspecified: Secondary | ICD-10-CM | POA: Diagnosis present

## 2018-05-04 DIAGNOSIS — Z3A25 25 weeks gestation of pregnancy: Secondary | ICD-10-CM

## 2018-05-04 DIAGNOSIS — Z3400 Encounter for supervision of normal first pregnancy, unspecified trimester: Secondary | ICD-10-CM

## 2018-05-04 DIAGNOSIS — R12 Heartburn: Secondary | ICD-10-CM

## 2018-05-04 DIAGNOSIS — O26899 Other specified pregnancy related conditions, unspecified trimester: Secondary | ICD-10-CM

## 2018-05-04 DIAGNOSIS — Z603 Acculturation difficulty: Secondary | ICD-10-CM | POA: Diagnosis present

## 2018-05-04 DIAGNOSIS — Z789 Other specified health status: Secondary | ICD-10-CM | POA: Diagnosis present

## 2018-05-04 LAB — TYPE AND SCREEN
ABO/RH(D): B POS
Antibody Screen: NEGATIVE

## 2018-05-04 LAB — CBC WITH DIFFERENTIAL/PLATELET
Basophils Absolute: 0 10*3/uL (ref 0.0–0.1)
Basophils Relative: 0 %
EOS ABS: 0.1 10*3/uL (ref 0.0–0.7)
Eosinophils Relative: 1 %
HCT: 35 % — ABNORMAL LOW (ref 36.0–46.0)
HEMOGLOBIN: 12.2 g/dL (ref 12.0–15.0)
LYMPHS ABS: 1.7 10*3/uL (ref 0.7–4.0)
LYMPHS PCT: 25 %
MCH: 30.9 pg (ref 26.0–34.0)
MCHC: 34.9 g/dL (ref 30.0–36.0)
MCV: 88.6 fL (ref 78.0–100.0)
Monocytes Absolute: 0.4 10*3/uL (ref 0.1–1.0)
Monocytes Relative: 5 %
NEUTROS PCT: 69 %
Neutro Abs: 4.6 10*3/uL (ref 1.7–7.7)
Platelets: 158 10*3/uL (ref 150–400)
RBC: 3.95 MIL/uL (ref 3.87–5.11)
RDW: 12.7 % (ref 11.5–15.5)
WBC: 6.7 10*3/uL (ref 4.0–10.5)

## 2018-05-04 LAB — HEMOGLOBIN A1C
HEMOGLOBIN A1C: 4.6 % — AB (ref 4.8–5.6)
MEAN PLASMA GLUCOSE: 85.32 mg/dL

## 2018-05-04 LAB — FIBRINOGEN: Fibrinogen: 616 mg/dL — ABNORMAL HIGH (ref 210–475)

## 2018-05-04 LAB — KLEIHAUER-BETKE STAIN
# Vials RhIg: 1
Fetal Cells %: 0 %
Quantitation Fetal Hemoglobin: 0 mL

## 2018-05-04 LAB — SAVE SMEAR

## 2018-05-04 LAB — PROTIME-INR
INR: 1.1
PROTHROMBIN TIME: 14.1 s (ref 11.4–15.2)

## 2018-05-04 LAB — PLATELET COUNT: Platelets: 180 10*3/uL (ref 150–400)

## 2018-05-04 LAB — ABO/RH: ABO/RH(D): B POS

## 2018-05-04 LAB — APTT: aPTT: 34 seconds (ref 24–36)

## 2018-05-04 MED ORDER — OXYTOCIN 40 UNITS IN LACTATED RINGERS INFUSION - SIMPLE MED: 2.5000 [IU]/h | INTRAVENOUS | Status: DC

## 2018-05-04 MED ORDER — PHENYLEPHRINE 40 MCG/ML (10ML) SYRINGE FOR IV PUSH (FOR BLOOD PRESSURE SUPPORT)
80.0000 ug | PREFILLED_SYRINGE | INTRAVENOUS | Status: DC | PRN
  Filled 2018-05-04: qty 5
  Filled 2018-05-04: qty 10

## 2018-05-04 MED ORDER — OXYCODONE-ACETAMINOPHEN 5-325 MG PO TABS: 1.0000 | ORAL_TABLET | ORAL | Status: DC | PRN

## 2018-05-04 MED ORDER — OXYCODONE-ACETAMINOPHEN 5-325 MG PO TABS: 2.0000 | ORAL_TABLET | ORAL | Status: DC | PRN

## 2018-05-04 MED ORDER — LIDOCAINE HCL (PF) 1 % IJ SOLN
30.0000 mL | INTRAMUSCULAR | Status: DC | PRN
  Filled 2018-05-04: qty 30

## 2018-05-04 MED ORDER — LACTATED RINGERS IV SOLN: 500.0000 mL | Freq: Once | INTRAVENOUS | Status: DC

## 2018-05-04 MED ORDER — OXYTOCIN BOLUS FROM INFUSION: 500.0000 mL | Freq: Once | INTRAVENOUS | Status: DC

## 2018-05-04 MED ORDER — ONDANSETRON HCL 4 MG/2ML IJ SOLN
4.0000 mg | Freq: Four times a day (QID) | INTRAMUSCULAR | Status: DC | PRN
  Administered 2018-05-05: 4 mg via INTRAVENOUS
  Filled 2018-05-04: qty 2

## 2018-05-04 MED ORDER — MISOPROSTOL 200 MCG PO TABS
200.0000 ug | ORAL_TABLET | Freq: Once | ORAL | Status: AC
  Administered 2018-05-04: 200 ug via ORAL
  Filled 2018-05-04: qty 1

## 2018-05-04 MED ORDER — FENTANYL CITRATE (PF) 100 MCG/2ML IJ SOLN
100.0000 ug | INTRAMUSCULAR | Status: DC | PRN
  Administered 2018-05-04 – 2018-05-05 (×3): 100 ug via INTRAVENOUS
  Filled 2018-05-04 (×4): qty 2

## 2018-05-04 MED ORDER — EPHEDRINE 5 MG/ML INJ
10.0000 mg | INTRAVENOUS | Status: DC | PRN
  Filled 2018-05-04: qty 2

## 2018-05-04 MED ORDER — MISOPROSTOL 200 MCG PO TABS
400.0000 ug | ORAL_TABLET | Freq: Once | ORAL | Status: AC
  Administered 2018-05-04: 400 ug via ORAL
  Filled 2018-05-04: qty 2

## 2018-05-04 MED ORDER — DIPHENHYDRAMINE HCL 50 MG/ML IJ SOLN: 12.5000 mg | INTRAMUSCULAR | Status: DC | PRN

## 2018-05-04 MED ORDER — PHENYLEPHRINE 40 MCG/ML (10ML) SYRINGE FOR IV PUSH (FOR BLOOD PRESSURE SUPPORT)
80.0000 ug | PREFILLED_SYRINGE | INTRAVENOUS | Status: DC | PRN
  Filled 2018-05-04: qty 5

## 2018-05-04 MED ORDER — LACTATED RINGERS IV SOLN
INTRAVENOUS | Status: DC
  Administered 2018-05-04: 12:00:00 via INTRAVENOUS

## 2018-05-04 MED ORDER — SOD CITRATE-CITRIC ACID 500-334 MG/5ML PO SOLN: 30.0000 mL | ORAL | Status: DC | PRN

## 2018-05-04 MED ORDER — ACETAMINOPHEN 325 MG PO TABS: 650.0000 mg | ORAL_TABLET | ORAL | Status: DC | PRN

## 2018-05-04 MED ORDER — FENTANYL 2.5 MCG/ML BUPIVACAINE 1/10 % EPIDURAL INFUSION (WH - ANES)
14.0000 mL/h | INTRAMUSCULAR | Status: DC | PRN
  Filled 2018-05-04: qty 100

## 2018-05-04 MED ORDER — LACTATED RINGERS IV SOLN
500.0000 mL | INTRAVENOUS | Status: DC | PRN
  Administered 2018-05-05: 1000 mL via INTRAVENOUS

## 2018-05-04 MED ORDER — MISOPROSTOL 200 MCG PO TABS
600.0000 ug | ORAL_TABLET | Freq: Once | ORAL | Status: AC
  Administered 2018-05-04: 600 ug via VAGINAL
  Filled 2018-05-04: qty 3

## 2018-05-04 NOTE — Progress Notes (Signed)
Labor Progress Note Stephanie Ali is a 27 y.o. G1P0 at [redacted]w[redacted]d presented for induction of labor 2/2 IUFD S: Contractions feeling more intense, requesting pain medicine.  Also has had 1 episode diarrhea.   O:  BP 105/64   Pulse 83   Temp 99 F (37.2 C) (Axillary)   Resp 16   Ht 5\' 4"  (1.626 m)   Wt 63.5 kg   LMP 11/09/2017 (Approximate)   BMI 24.03 kg/m  EFM: IUFD  CVE:  Internal Os closed, thick. -2 station   A&P: 27 y.o. G1P0 [redacted]w[redacted]d IOL 2/2 IUFD.  #Labor: s/p PO 200 and 400 cytotec x2, placed 600 intravaginal. Recheck in 4 hrs.  #Pain: IV fentanyl #FWB: IUFD. Bedside US vertex #GBS not done  Denzil Hughes, MD 10:34 PM

## 2018-05-04 NOTE — H&P (Signed)
OBSTETRIC ADMISSION HISTORY AND PHYSICAL  Stephanie Ali is a 27 y.o. female G1P0 with IUP at [redacted]w[redacted]d by L/20 presenting for induction of labor IUFD. Patient was seen in clinic yesterday and reported no fetal movement for the past week. She had an U/S with MFM yesterday to follow-up her anatomy scan, and it was at that time that IUFD was complete and fetal biometry was consistent with [redacted] weeks gestation. Patient declined amniocentesis. Husband lives in [place].   She received her prenatal care at [facility].  Support person in labor: Sister and Mother   Ultrasounds .  20w3: anatomy U/S, normal scan including fluid levels, limited views od fetal spine and aortic and ductal arches . 25w0: Returned for U/S to follow-up anatomy scan but no cardiac activity   Prenatal History/Complications: . None  Past Medical History: History reviewed. No pertinent past medical history.  Past Surgical History: Past Surgical History:  Procedure Laterality Date  . NO PAST SURGERIES      Obstetrical History: OB History    Gravida  1   Para      Term      Preterm      AB      Living        SAB      TAB      Ectopic      Multiple      Live Births              Social History: Social History   Socioeconomic History  . Marital status: Married    Spouse name: Not on file  . Number of children: Not on file  . Years of education: Not on file  . Highest education level: Not on file  Occupational History  . Not on file  Social Needs  . Financial resource strain: Not on file  . Food insecurity:    Worry: Not on file    Inability: Not on file  . Transportation needs:    Medical: Not on file    Non-medical: Not on file  Tobacco Use  . Smoking status: Never Smoker  . Smokeless tobacco: Never Used  Substance and Sexual Activity  . Alcohol use: Never    Frequency: Never  . Drug use: Never  . Sexual activity: Yes    Birth control/protection: None  Lifestyle  . Physical activity:     Days per week: Not on file    Minutes per session: Not on file  . Stress: Not on file  Relationships  . Social connections:    Talks on phone: Not on file    Gets together: Not on file    Attends religious service: Not on file    Active member of club or organization: Not on file    Attends meetings of clubs or organizations: Not on file    Relationship status: Not on file  Other Topics Concern  . Not on file  Social History Narrative  . Not on file    Family History: History reviewed. No pertinent family history.  Allergies: No Known Allergies  Medications Prior to Admission  Medication Sig Dispense Refill Last Dose  . ranitidine (ZANTAC) 75 MG tablet Take 2 tablets (150 mg total) by mouth 2 (two) times daily. (Patient taking differently: Take 75 mg by mouth daily. ) 60 tablet 1 05/03/2018 at Unknown time  . Doxylamine-Pyridoxine (DICLEGIS) 10-10 MG TBEC Take 2 tablets by mouth at bedtime. If symptoms persist, add one tablet in the morning  and one in the afternoon (Patient not taking: Reported on 03/16/2018) 100 tablet 5 Not Taking  . Prenatal Vit-Fe Fum-Fe Bisg-FA (NATACHEW) 28-1 MG CHEW Chew 1 tablet by mouth daily. (Patient taking differently: Chew 1 tablet by mouth every other day. ) 30 tablet 8 05/02/2018     Review of Systems  All systems reviewed and negative except as stated in HPI  Blood pressure 111/63, pulse 87, temperature 98.4 F (36.9 C), temperature source Oral, resp. rate 18, height 5\' 4"  (1.626 m), weight 63.5 kg, last menstrual period 11/09/2017. General appearance: well-appearing, NAD, occasionally tearful Lungs: no respiratory distress Heart: regular rate  Abdomen: soft, non-tender; gravid b Pelvic: deferred Extremities: no sign of DVT Presentation: deferred    Prenatal labs: ABO, Rh: B/Positive/-- (06/20 1045) Antibody: Negative (06/20 1045) Rubella: 11.60 (06/20 1045) RPR: Non Reactive (06/20 1045)  HBsAg: Negative (06/20 1045)  HIV: Non Reactive  (06/20 1045)  GBS:   n/a Glucola: n/a Genetic screening:  declined  Prenatal Transfer Tool  Maternal Diabetes: No Genetic Screening: Declined Maternal Ultrasounds/Referrals: Abnormal:  Findings:   Other: as above Fetal Ultrasounds or other Referrals:  None Maternal Substance Abuse:  No Significant Maternal Medications:  None Significant Maternal Lab Results: None  No results found for this or any previous visit (from the past 24 hour(s)).  Patient Active Problem List   Diagnosis Date Noted  . IUFD at 20 weeks or more of gestation 05/04/2018  . Supervision of normal first pregnancy, antepartum 02/11/2018  . Language barrier 02/11/2018    Assessment/Plan:  Stephanie Ali is a 39 y.o. G1P0 at [redacted]w[redacted]d here for IOL for IUFD presumed to be around 22 weeks based on U/S yesterday. Will induce labor with oral cytotec and plan to increase dose as needed. Family prefers not to have any testing or autopsy and wishes to have funeral services after delivery. Process for induction of labor, pain control options discussed with use of video interpreter.   Cristal Deer. Earlene Plater, DO OB/GYN Fellow

## 2018-05-05 ENCOUNTER — Inpatient Hospital Stay (HOSPITAL_COMMUNITY): Payer: Medicaid Other | Admitting: Anesthesiology

## 2018-05-05 DIAGNOSIS — Z3A25 25 weeks gestation of pregnancy: Secondary | ICD-10-CM

## 2018-05-05 DIAGNOSIS — O364XX0 Maternal care for intrauterine death, not applicable or unspecified: Secondary | ICD-10-CM

## 2018-05-05 LAB — RPR: RPR: NONREACTIVE

## 2018-05-05 MED ORDER — IBUPROFEN 600 MG PO TABS: 600.0000 mg | ORAL_TABLET | Freq: Four times a day (QID) | ORAL | Status: DC

## 2018-05-05 MED ORDER — SIMETHICONE 80 MG PO CHEW: 80.0000 mg | CHEWABLE_TABLET | ORAL | Status: DC | PRN

## 2018-05-05 MED ORDER — DIPHENHYDRAMINE HCL 25 MG PO CAPS: 25.0000 mg | ORAL_CAPSULE | Freq: Four times a day (QID) | ORAL | Status: DC | PRN

## 2018-05-05 MED ORDER — BENZOCAINE-MENTHOL 20-0.5 % EX AERO: 1.0000 "application " | INHALATION_SPRAY | CUTANEOUS | Status: DC | PRN

## 2018-05-05 MED ORDER — PRENATAL MULTIVITAMIN CH
1.0000 | ORAL_TABLET | Freq: Every day | ORAL | Status: DC
  Administered 2018-05-06: 1 via ORAL
  Filled 2018-05-05: qty 1

## 2018-05-05 MED ORDER — SENNOSIDES-DOCUSATE SODIUM 8.6-50 MG PO TABS
2.0000 | ORAL_TABLET | ORAL | Status: DC
  Administered 2018-05-05: 2 via ORAL
  Filled 2018-05-05: qty 2

## 2018-05-05 MED ORDER — IBUPROFEN 600 MG PO TABS
600.0000 mg | ORAL_TABLET | Freq: Four times a day (QID) | ORAL | Status: DC
  Administered 2018-05-05 – 2018-05-06 (×4): 600 mg via ORAL
  Filled 2018-05-05 (×4): qty 1

## 2018-05-05 MED ORDER — ONDANSETRON HCL 4 MG PO TABS: 4.0000 mg | ORAL_TABLET | ORAL | Status: DC | PRN

## 2018-05-05 MED ORDER — DIBUCAINE 1 % RE OINT: 1.0000 "application " | TOPICAL_OINTMENT | RECTAL | Status: DC | PRN

## 2018-05-05 MED ORDER — ACETAMINOPHEN 325 MG PO TABS: 650.0000 mg | ORAL_TABLET | ORAL | Status: DC | PRN

## 2018-05-05 MED ORDER — COCONUT OIL OIL: 1.0000 "application " | TOPICAL_OIL | Status: DC | PRN

## 2018-05-05 MED ORDER — WITCH HAZEL-GLYCERIN EX PADS: 1.0000 "application " | MEDICATED_PAD | CUTANEOUS | Status: DC | PRN

## 2018-05-05 MED ORDER — MISOPROSTOL 200 MCG PO TABS
600.0000 ug | ORAL_TABLET | Freq: Once | ORAL | Status: AC
  Administered 2018-05-05: 600 ug via VAGINAL
  Filled 2018-05-05: qty 3

## 2018-05-05 MED ORDER — ZOLPIDEM TARTRATE 5 MG PO TABS: 5.0000 mg | ORAL_TABLET | Freq: Every evening | ORAL | Status: DC | PRN

## 2018-05-05 MED ORDER — IBUPROFEN 600 MG PO TABS
600.0000 mg | ORAL_TABLET | Freq: Four times a day (QID) | ORAL | Status: DC | PRN
  Administered 2018-05-05: 600 mg via ORAL
  Filled 2018-05-05: qty 1

## 2018-05-05 MED ORDER — ONDANSETRON HCL 4 MG/2ML IJ SOLN: 4.0000 mg | INTRAMUSCULAR | Status: DC | PRN

## 2018-05-05 NOTE — Progress Notes (Signed)
Labor Progress Note Stephanie Ali is a 27 y.o. G1P0 at [redacted]w[redacted]d presented for induction of labor2/2IUFD S: contractions ~q69min. Sleeping on and off  O:  BP 113/61   Pulse 66   Temp 98.2 F (36.8 C) (Oral)   Resp 18   Ht 5\' 4"  (1.626 m)   Wt 63.5 kg   LMP 11/09/2017 (Approximate)   BMI 24.03 kg/m  EFM: none  CVE: Dilation: 2.5 Effacement (%): 70 Station: 0 Presentation: Vertex Exam by:: Josel Keo, K   A&P: 27 y.o. G1P0 [redacted]w[redacted]d w/ IOL 2/2 IUFD #Labor: Progressing well. s/p PO 200 and 400 cytotec x2,  600 intravaginal x2. Consider redosing cytotec in 1 hr #Pain:IV fentanyl #FWB:IUFD #GBSnot done  Denzil Hughes, MD 6:29 AM

## 2018-05-05 NOTE — Progress Notes (Signed)
Labor Progress Note Brooklen Bruyere is a 27 y.o. G1P0 at [redacted]w[redacted]d presented for induction of labor 2/2 IUFD S: Contractions q5 min, managing w/ fentanyl, requesting another dose now. Afebrile. Notably emotional.  O:  BP (!) 113/59   Pulse 86   Temp 99 F (37.2 C) (Axillary)   Resp 16   Ht 5\' 4"  (1.626 m)   Wt 63.5 kg   LMP 11/09/2017 (Approximate)   BMI 24.03 kg/m  EFM: none  CVE:  1/50/0   A&P: 27 y.o. G1P0 [redacted]w[redacted]d w/ IL 2/2 IUFD #Labor: Progressing well. s/p PO 200 and 400 cytotec x2,  600 intravaginal x2. Recheck in 4 hrs.  #Pain: IV fentanyl #FWB: IUFD #GBS not done  Denzil Hughes, MD 3:07 AM

## 2018-05-05 NOTE — Progress Notes (Addendum)
[facility] [place] [facility] [place] [address] [place]  [address] [phone]  Garry Heater will pick infant up.  He is the brother in law.

## 2018-05-05 NOTE — Progress Notes (Signed)
IV ok to remain saline locked per Joellyn Haff, CNM

## 2018-05-05 NOTE — Plan of Care (Signed)
Patient has signed form for her sister-in-law to interpret. Patient gave consent via Urdu translator on Stratus.

## 2018-05-05 NOTE — Progress Notes (Signed)
Patient does not want a female doctor.  Will wait for female anesthesiologist to place her epidural.  Patient is aware that it could be around 1 1/2 hours before she can receive her epidural.

## 2018-05-05 NOTE — Anesthesia Preprocedure Evaluation (Deleted)
Anesthesia Evaluation  Patient identified by MRN, date of birth, ID band Patient awake    Reviewed: Allergy & Precautions, NPO status , Patient's Chart, lab work & pertinent test results  History of Anesthesia Complications Negative for: history of anesthetic complications  Airway Mallampati: II  TM Distance: >3 FB Neck ROM: Full    Dental   Pulmonary neg pulmonary ROS,    breath sounds clear to auscultation       Cardiovascular negative cardio ROS   Rhythm:Regular Rate:Normal     Neuro/Psych negative neurological ROS  negative psych ROS   GI/Hepatic negative GI ROS, Neg liver ROS,   Endo/Other  negative endocrine ROS  Renal/GU negative Renal ROS  negative genitourinary   Musculoskeletal negative musculoskeletal ROS (+)   Abdominal   Peds  Hematology negative hematology ROS (+)   Anesthesia Other Findings   Reproductive/Obstetrics  IUFD                             Anesthesia Physical Anesthesia Plan  ASA: II  Anesthesia Plan: Epidural   Post-op Pain Management:    Induction:   PONV Risk Score and Plan: Treatment may vary due to age or medical condition  Airway Management Planned: Natural Airway  Additional Equipment: None  Intra-op Plan:   Post-operative Plan:   Informed Consent: I have reviewed the patients History and Physical, chart, labs and discussed the procedure including the risks, benefits and alternatives for the proposed anesthesia with the patient or authorized representative who has indicated his/her understanding and acceptance.     Plan Discussed with: Anesthesiologist  Anesthesia Plan Comments: (Labs reviewed. Platelets acceptable, patient not taking any blood thinning medications. Per RN, FHR tracing reported to be stable enough for sitting procedure. Risks and benefits discussed with patient, including PDPH, backache, epidural hematoma, failed  epidural, allergic reaction, and nerve injury. Patient expressed understanding and wished to proceed.)       Anesthesia Quick Evaluation

## 2018-05-06 LAB — CULTURE, OB URINE: CULTURE: NO GROWTH

## 2018-05-06 LAB — TORCH-IGM(TOXO/ RUB/ CMV/ HSV) W TITER
CMV IgM: <30 AU/mL (ref 0.0–29.9)
HSVI/II Comb IgM: 0.97 Ratio — ABNORMAL HIGH (ref 0.00–0.90)
TOXOPLASMA ANTIBODY- IGM: 9.2 [AU]/ml — AB (ref 0.0–7.9)

## 2018-05-06 MED ORDER — RANITIDINE HCL 150 MG PO TABS: 150.0000 mg | ORAL_TABLET | Freq: Two times a day (BID) | ORAL | 2 refills | Status: AC

## 2018-05-06 MED ORDER — CYCLOBENZAPRINE HCL 10 MG PO TABS: 10.0000 mg | ORAL_TABLET | Freq: Three times a day (TID) | ORAL | 2 refills | Status: AC | PRN

## 2018-05-06 MED ORDER — IBUPROFEN 600 MG PO TABS: 600.0000 mg | ORAL_TABLET | Freq: Four times a day (QID) | ORAL | 2 refills | Status: AC | PRN

## 2018-05-06 NOTE — Progress Notes (Signed)
I offered support throughout Sharicka's hospitalization on Tuesday, 9/10 and Wednesday 9/11.  I spent time with the family as they held their baby girl and helped staff navigate the different cultural and religious needs of the family.  Over the two days I spent >4 hours with them.    Chaplain Dyanne CarrelKaty Duayne Brideau, Bcc Pager, [phone] 12:49 PM    05/06/18 1200  Clinical Encounter Type  Visited With Patient and family together  Visit Type Spiritual support  Referral From Nurse

## 2018-05-06 NOTE — Progress Notes (Signed)
D/c instructions discussed using stratus interpreter.  Educated on prescriptions & when to call the doctor.  Family also at bedside for d/c instructions.  Pt stable for d/c & instructed to notify staff when ready for escort out of hospital.

## 2018-05-06 NOTE — Discharge Instructions (Signed)
Vaginal Delivery, Care After °Refer to this sheet in the next few weeks. These instructions provide you with information on caring for yourself after your delivery. Your health care provider may also give you more specific instructions. Your treatment has been planned according to current medical practices, but problems sometimes occur. Call your health care provider if you have any problems or questions after your delivery. °What to expect after your delivery °After your delivery, it is typical to have the following: °· You may feel pain in the vaginal area for several days after delivery. If you had an incision or a vaginal tear, the area will probably continue to be tender to the touch for several weeks. °· You may feel very fatigued after a vaginal delivery. °· You may have vaginal bleeding and discharge that will start out red, then become pink, then yellow, then white. Altogether, this usually lasts for about 6 weeks. °· The combination of having lost your baby and changing hormones from the delivery can make you feel very sad. You may also experience emotions that change very quickly. Some of the emotions people often notice after loss include: °? Anger. °? Denial. °? Guilt. °? Sorrow. °? Depression. °? Grief. °? Relationship problems. ° °Follow these instructions at home: °· Consider seeking support for your loss. Some forms of support that you might consider include your religious leader, friends, family, a professional counselor, or a bereavement support group. °· Take medicines only as directed by your health care provider. °· Continue to use good perineal care. Good perineal care includes: °? Wiping your perineum from front to back. °? Keeping your perineum clean. °· Do not use tampons or douche until your health care provider says it is okay. °· Shower, wash your hair, and take tub baths as directed by your health care provider. °· Wear a well-fitting bra that provides breast support. °· Drink enough  fluids to keep your urine clear or pale yellow. °· Eat healthy foods. °· Eat high-fiber foods every day, such as whole grain cereals and breads, brown rice, beans, and fresh fruits and vegetables. These foods may help prevent or relieve constipation. °· Follow your health care provider's directions about resuming activities such as climbing stairs, driving, lifting, exercising, or traveling. °· Increase your activities gradually. °· Talk to your health care provider about resuming sexual activities. This depends on your risk of infection, your rate of healing, and your comfort and desire to resume sexual activity. °· Try to have someone help you with your household activities for at least a few days after you leave the hospital. °· Rest as much as possible. °· Keep all of your scheduled postpartum appointments. It is very important to keep your scheduled follow-up appointments. At these appointments, your health care provider will be checking to make sure that you are healing physically and emotionally. °· Do not drink alcohol, especially if you are taking medicine to relieve pain. °· Do not use any tobacco products including cigarettes, chewing tobacco, or electronic cigarettes. If you need help quitting, ask your health care provider. °· Do not use illegal drugs. °Contact a health care provider if: °· You feel sad or depressed. °· You have thoughts of hurting yourself. °· You are having trouble eating or sleeping. °· You cannot enjoy the things in life you have previously enjoyed. °· You are passing large clots from your vagina. Save any clots to show your health care provider. °· You have a bad smelling discharge from your vagina. °·   You have trouble urinating. °· You are urinating frequently. °· You have pain when you urinate. °· You have a change in your bowel movements. °· You have increasing redness, pain, or swelling near your incision or vaginal tear. °· You have pus draining from your incision or vaginal  tear. °· Your incision or vaginal tear is separating. °· You have painful, hard, or reddened breasts. °· You have a severe headache. °· You have blurred vision or see spots. °· You are dizzy or light-headed. °· You have a rash. °· You have nausea or vomiting. °· You have not had a menstrual period by the 12th week after delivery. °· You have a fever. °Get help right away if: °· You are concerned that you may hurt yourself or you are considering suicide. °· You have persistent pain. °· You have chest pain. °· You have shortness of breath. °· You faint. °· You have leg pain. °· You have stomach pain. °· Your vaginal bleeding saturates two or more sanitary pads in 1 hour. °This information is not intended to replace advice given to you by your health care provider. Make sure you discuss any questions you have with your health care provider. °Document Released: 12/26/2013 Document Revised: 01/17/2016 Document Reviewed: 09/29/2013 °Elsevier Interactive Patient Education © 2018 Elsevier Inc. ° °

## 2018-05-06 NOTE — Discharge Summary (Signed)
OB Discharge Summary     Patient Name: Stephanie Ali DOB: 04/03/1991 MRN: [id number]  Date of admission: 05/04/2018 Delivering MD: Shawna ClampBOOKER, KIMBERLY R   Date of discharge: 05/06/2018  Intrauterine pregnancy: 5951w3d     Principal Problem:   IUFD at 20 weeks or more of gestation Active Problems:   Language barrier  Additional problems: None     Discharge diagnosis: IUFD delivered                                                                                                Post partum procedures:None  Augmentation: Pitocin  Complications: None  Hospital course:  Induction of Labor With Vaginal Delivery   27 y.o. yo G1P0 at 7051w3d was admitted to the hospital 05/04/2018 for induction of labor.  Indication for induction: IUFD.  Patient had an uncomplicated labor course as follows: Membrane Rupture Time/Date: 6:51 AM ,05/05/2018   Intrapartum Procedures: Episiotomy:                                           Lacerations:     Patient had delivery of a Non Viable infant.  Information for the patient's newborn:  Stephanie Ali, PendingBaby FD [[id number]]  Delivery Method: Vaginal, Spontaneous(Filed from Delivery Summary) Details of delivery can be found in separate delivery note.  Patient had an uncomplicated postpartum course. Of note, IUFD evaluation studies were largely negative, some still pending at the time of discharge.  Appropriate support given to her and her family. Patient is discharged to home 05/06/18.  Physical exam  Vitals:   05/05/18 1600 05/05/18 1943 05/06/18 0607 05/06/18 0857  BP: 96/68 (!) 87/64 113/73 116/76  Pulse: 63 68 74 73  Resp: 16 16 16 18   Temp: 99.3 F (37.4 C) 98.9 F (37.2 C) 98.2 F (36.8 C) 97.8 F (36.6 C)  TempSrc: Oral Oral Oral Oral  SpO2: 100%  100% 100%  Weight:      Height:       General: alert and no distress Lochia: appropriate Uterine Fundus: firm DVT Evaluation: No evidence of DVT seen on physical exam. Negative Homan's sign. No cords or calf  tenderness. No significant calf/ankle edema. Labs: Lab Results  Component Value Date   WBC 6.7 05/04/2018   HGB 12.2 05/04/2018   HCT 35.0 (L) 05/04/2018   MCV 88.6 05/04/2018   PLT 180 05/04/2018   No flowsheet data found.  Discharge instruction: per After Visit Summary and "Baby and Me Booklet".  After visit meds:  Allergies as of 05/06/2018   No Known Allergies     Medication List    STOP taking these medications   Doxylamine-Pyridoxine 10-10 MG Tbec   NATACHEW 28-1 MG Chew     TAKE these medications   cyclobenzaprine 10 MG tablet Commonly known as:  FLEXERIL Take 1 tablet (10 mg total) by mouth 3 (three) times daily as needed for muscle spasms.   ibuprofen 600 MG tablet Commonly known as:  ADVIL,MOTRIN Take 1 tablet (  600 mg total) by mouth every 6 (six) hours as needed for moderate pain or cramping.   ranitidine 150 MG tablet Commonly known as:  ZANTAC Take 1 tablet (150 mg total) by mouth 2 (two) times daily. What changed:  medication strength       Diet: routine diet  Activity: Advance as tolerated. Pelvic rest for 6 weeks.   Future Appointments  Date Time Provider Department Center  05/18/2018 10:45 AM Stephanie Ali, CNM [facility] None   Postpartum contraception: Not Discussed   05/06/2018 Stephanie Collins, MD

## 2018-05-10 ENCOUNTER — Telehealth: Payer: Self-pay

## 2018-05-10 NOTE — Telephone Encounter (Signed)
Pt's sister-n-law called the office stating that pt is having some pain and burning with urination and is also feeling a burning sensation inside of her body. Pt is scheduled to come in on 9/17. Understanding was voiced.

## 2018-05-11 ENCOUNTER — Ambulatory Visit (INDEPENDENT_AMBULATORY_CARE_PROVIDER_SITE_OTHER): Payer: Medicaid Other

## 2018-05-11 VITALS — BP 108/64 | HR 74 | Ht 64.0 in | Wt 138.0 lb

## 2018-05-11 DIAGNOSIS — R399 Unspecified symptoms and signs involving the genitourinary system: Secondary | ICD-10-CM

## 2018-05-11 DIAGNOSIS — B373 Candidiasis of vulva and vagina: Secondary | ICD-10-CM

## 2018-05-11 DIAGNOSIS — B3731 Acute candidiasis of vulva and vagina: Secondary | ICD-10-CM

## 2018-05-11 DIAGNOSIS — K5900 Constipation, unspecified: Secondary | ICD-10-CM

## 2018-05-11 LAB — POCT URINALYSIS DIPSTICK
BILIRUBIN UA: NEGATIVE
GLUCOSE UA: NEGATIVE
KETONES UA: NEGATIVE
Leukocytes, UA: NEGATIVE
Nitrite, UA: NEGATIVE
Protein, UA: NEGATIVE
SPEC GRAV UA: 1.015 (ref 1.010–1.025)

## 2018-05-11 MED ORDER — DOCUSATE SODIUM 250 MG PO CAPS: 250.0000 mg | ORAL_CAPSULE | Freq: Every day | ORAL | 0 refills | Status: AC

## 2018-05-11 MED ORDER — TERCONAZOLE 0.4 % VA CREA: 1.0000 | TOPICAL_CREAM | Freq: Every day | VAGINAL | 0 refills | Status: AC

## 2018-05-11 NOTE — Patient Instructions (Signed)

## 2018-05-11 NOTE — Progress Notes (Signed)
Pt has been having burning with urination x3 days.

## 2018-05-11 NOTE — Progress Notes (Signed)
History:  Ms. Stephanie Ali is a 27 y.o. G1P0 who presents to clinic today for burning with urination. She reports burning on the inside and outside of her vagina with every time she uses the bathroom.    The patient had a vaginal delivery of a 22 week IUFD last week.   The following portions of the patient's history were reviewed and updated as appropriate: allergies, current medications, family history, past medical history, social history, past surgical history and problem list.  Review of Systems:  Review of Systems  Constitutional: Negative.  Negative for chills and fever.  Respiratory: Negative.   Cardiovascular: Negative.  Negative for chest pain.  Genitourinary: Positive for dysuria.  Neurological: Negative.  Negative for dizziness and headaches.      Objective:  Physical Exam BP 108/64   Pulse 74   Ht 5\' 4"  (1.626 m)   Wt 138 lb (62.6 kg)   LMP 11/09/2017 (Approximate)   Breastfeeding? Unknown   BMI 23.69 kg/m  Physical Exam  Constitutional: She is oriented to person, place, and time. She appears well-developed and well-nourished. No distress.  HENT:  Head: Normocephalic.  Eyes: Pupils are equal, round, and reactive to light.  Neck: Normal range of motion.  Cardiovascular: Normal rate and regular rhythm.  Pulmonary/Chest: Effort normal and breath sounds normal. No respiratory distress.  Abdominal: Soft. There is no tenderness.  Genitourinary:  Genitourinary Comments: Erythema on labia and vaginal walls  Musculoskeletal: Normal range of motion.  Neurological: She is alert and oriented to person, place, and time.  Skin: Skin is warm and dry.  Psychiatric: She has a normal mood and affect. Her behavior is normal. Judgment and thought content normal.  Nursing note and vitals reviewed.  Labs and Imaging Results for orders placed or performed in visit on 05/11/18 (from the past 24 hour(s))  POCT urinalysis dipstick     Status: Abnormal   Collection Time: 05/11/18 11:18 AM   Result Value Ref Range   Color, UA     Clarity, UA     Glucose, UA Negative Negative   Bilirubin, UA negative    Ketones, UA negative    Spec Grav, UA 1.015 1.010 - 1.025   Blood, UA large    pH, UA     Protein, UA Negative Negative   Urobilinogen, UA     Nitrite, UA negative    Leukocytes, UA Negative Negative   Appearance     Odor      Assessment & Plan:  1. UTI symptoms - No leukocytes or nitrites. Will culture - POCT urinalysis dipstick - Urine Culture  2. Candidiasis, vagina - Terazol sent to patient's pharmacy for suspected yeast infection  3. Constipation, unspecified constipation type -Patient reports constipation since she was discharged from the hospital. OTC measures reviewed with patient and colace sent to pharmacy.   Attempted to talk to patient about mood and feelings since delivery. Unable to do so due to family involvement in the conversation and patient's unwillingness to speak in front of family. Patient to follow up next week as scheduled for her appointment after her loss and will attempt to talk to patient without family in the room.   Rolm Bookbindereill, Shatasha Lambing M, [place] 05/11/2018 11:42 AM

## 2018-05-12 ENCOUNTER — Encounter: Payer: Medicaid Other | Admitting: Obstetrics & Gynecology

## 2018-05-12 ENCOUNTER — Telehealth: Payer: Self-pay

## 2018-05-12 DIAGNOSIS — B373 Candidiasis of vulva and vagina: Secondary | ICD-10-CM

## 2018-05-12 DIAGNOSIS — B3731 Acute candidiasis of vulva and vagina: Secondary | ICD-10-CM

## 2018-05-12 NOTE — Telephone Encounter (Signed)
Patient sister in law called and states that the teroconazole was too expensive. Patient would like to take something else.   Made her aware I will route to provider for input and if there is another option for the patient. Armandina StammerJennifer Howard RN

## 2018-05-13 LAB — URINE CULTURE: Organism ID, Bacteria: NO GROWTH

## 2018-05-14 MED ORDER — FLUCONAZOLE 150 MG PO TABS: 150.0000 mg | ORAL_TABLET | Freq: Once | ORAL | 0 refills | Status: AC

## 2018-05-14 NOTE — Telephone Encounter (Signed)
RX for diflucan sent to patient's pharmacy. Discussed with patient use of OTC cream for irritation as well.  Stephanie Ali, CNM  05/14/18 9:05 AM

## 2018-05-18 ENCOUNTER — Ambulatory Visit: Payer: Medicaid Other | Admitting: Advanced Practice Midwife

## 2018-05-25 ENCOUNTER — Encounter: Payer: Self-pay | Admitting: Advanced Practice Midwife

## 2018-05-25 ENCOUNTER — Ambulatory Visit (INDEPENDENT_AMBULATORY_CARE_PROVIDER_SITE_OTHER): Payer: Medicaid Other | Admitting: Advanced Practice Midwife

## 2018-05-25 VITALS — BP 111/81 | HR 99 | Wt 142.0 lb

## 2018-05-25 DIAGNOSIS — Z789 Other specified health status: Secondary | ICD-10-CM

## 2018-05-25 DIAGNOSIS — Z1389 Encounter for screening for other disorder: Secondary | ICD-10-CM | POA: Diagnosis not present

## 2018-05-25 DIAGNOSIS — O364XX0 Maternal care for intrauterine death, not applicable or unspecified: Secondary | ICD-10-CM

## 2018-05-25 NOTE — Patient Instructions (Signed)

## 2018-05-25 NOTE — Progress Notes (Signed)
FETAL DEMISE  Post Partum Exam  Stephanie Ali is a 27 y.o. G1P0 female who presents for a postpartum visit. She is 3 weeks postpartum following a spontaneous vaginal delivery. I have fully reviewed the prenatal and intrapartum course. The delivery was at 25.3 gestational weeks.  Anesthesia: epidural. Postpartum course has been uneventful. FETAL DEMISE. Bleeding brown. Bowel function is normal. Bladder function is normal. Patient is not sexually active. Contraception method is none. Postpartum depression screening:Positive (score 13)   Pt is sad but states is coping well.  Please she got to hold baby and appreciated support at hospital  Family member interpreting today per pt request.  States she is doing well but is still having some upper back and neck pain, relieved by ibuprofen and Flexeril Advised to try ice. And use PT or possibly see Dr Adrian Blackwater if continues.   [place]. Pt has [employer], and is staying here with family.   The following portions of the patient's history were reviewed and updated as appropriate: allergies, current medications, past family history, past medical history, past social history, past surgical history and problem list. Last pap smear done 02-11-18 and was Normal  Review of Systems Pertinent items are noted in HPI.    Objective:  Blood pressure 111/81, pulse 99, weight 142 lb (64.4 kg), last menstrual period 11/09/2017, unknown if currently breastfeeding.  General:  alert, cooperative and no distress   Breasts:  inspection negative, no nipple discharge or bleeding, no masses or nodularity palpable  Lungs: respirations unlabored  Heart:  regular rate and rhythm, S1, S2 normal, no murmur, click, rub or gallop  Abdomen: soft, non-tender; bowel sounds normal; no masses,  no organomegaly   Vulva:  not evaluated  Vagina: not evaluated  Cervix:  not evaluated  Corpus: not examined  Adnexa:  not evaluated  Rectal Exam: Not  performed.        Assessment:    Normal  postpartum exam. Pap smear not done at today's visit.   Plan:   1. Contraception: abstinence 2. Husband is in [place], for probably 2 more years.  Declines contraception 3. Follow up in: PRN

## 2020-05-29 IMAGING — US US MFM OB COMP +14 WKS
1 series · 14 of 28 positions shown · non-contrast
Comparison: none

[Series 1: us mfm ob comp +14 wks · 81 acquisitions, 14 frames shown]
[im 3/81]
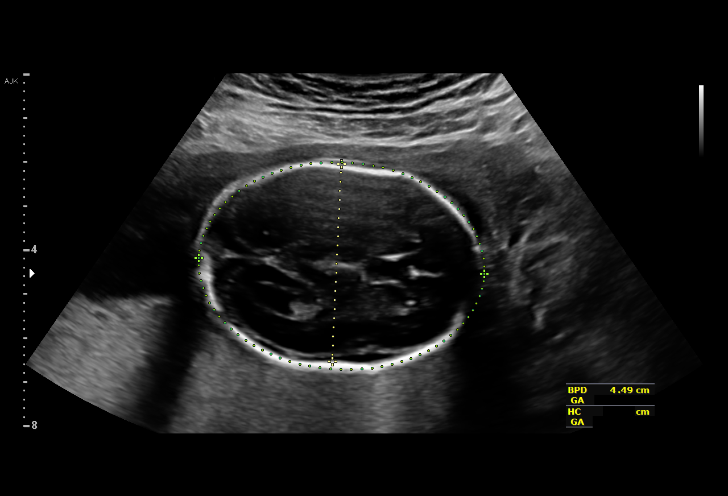
[im 9/81]
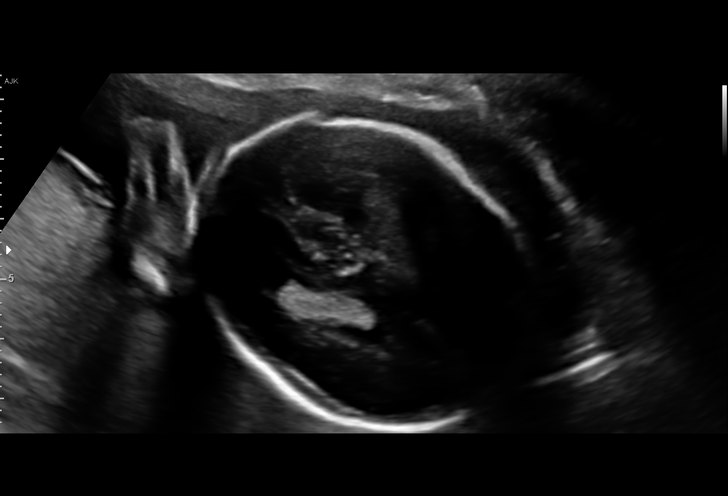
[im 15/81]
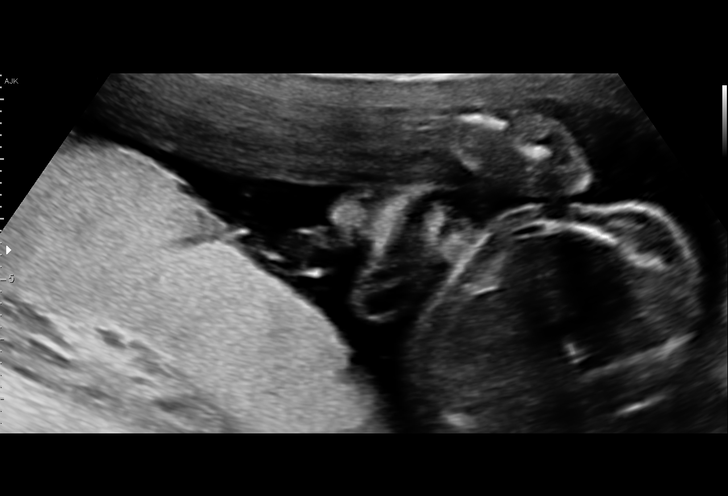
[im 21/81]
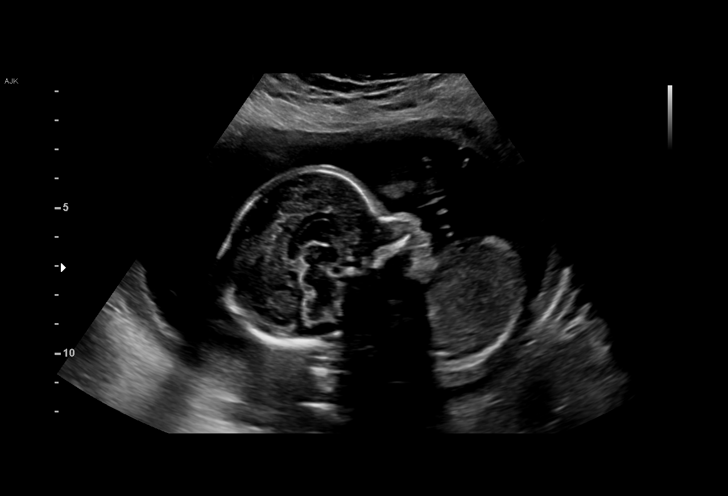
[im 27/81]
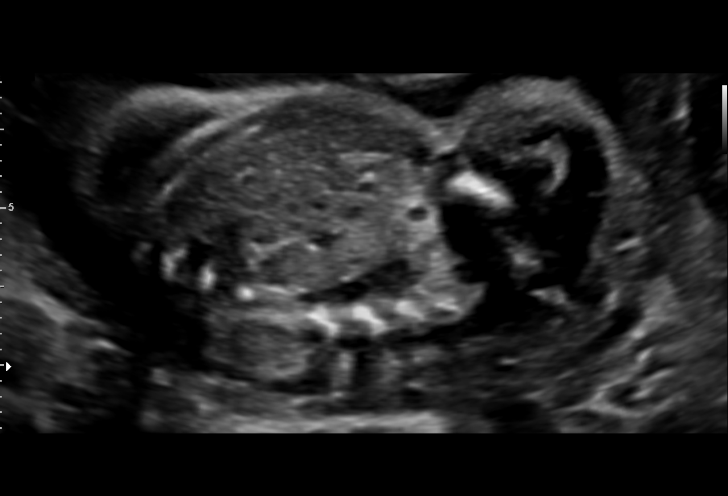
[im 33/81]
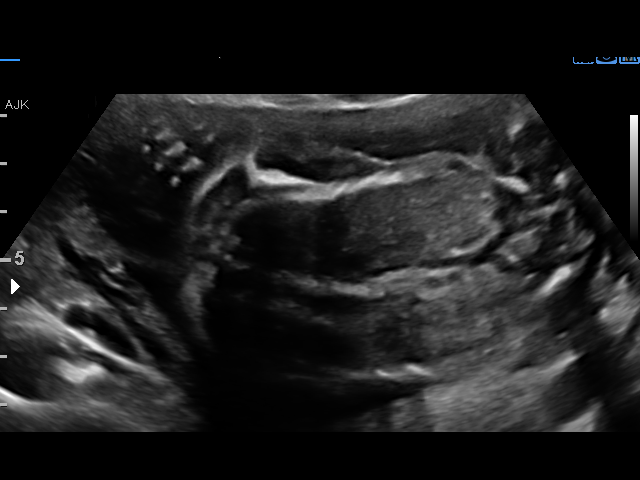
[im 39/81]
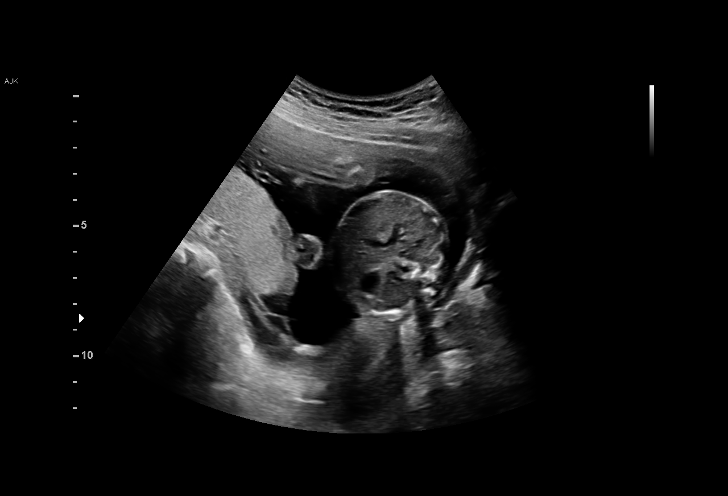
[im 45/81]
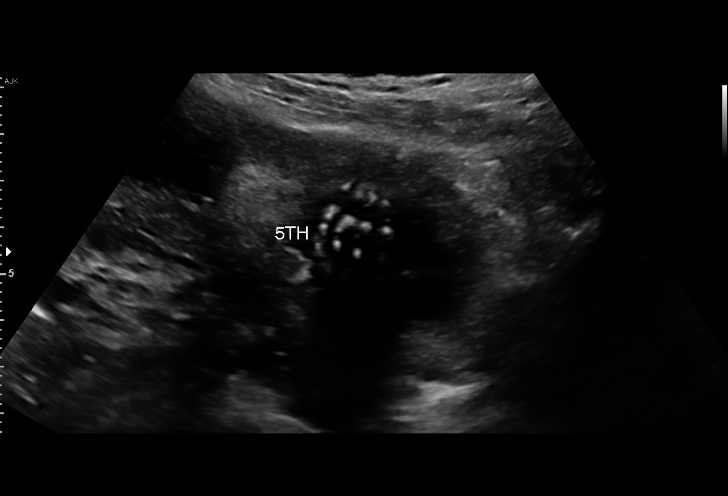
[im 51/81]
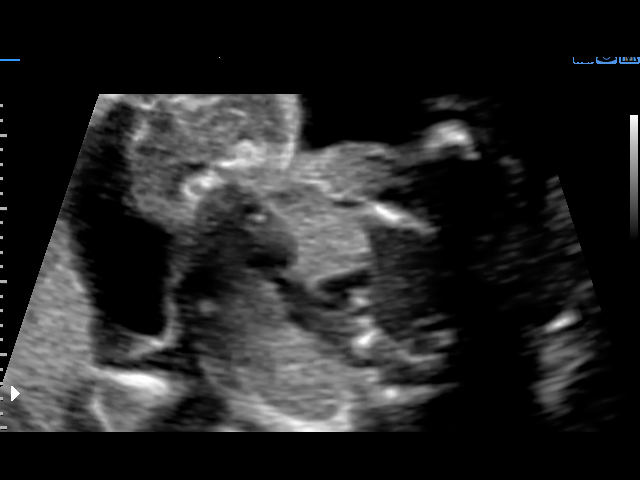
[im 57/81]
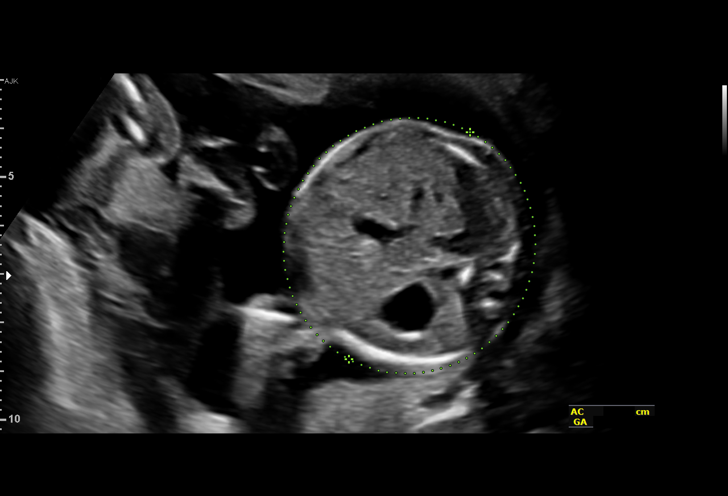
[im 63/81]
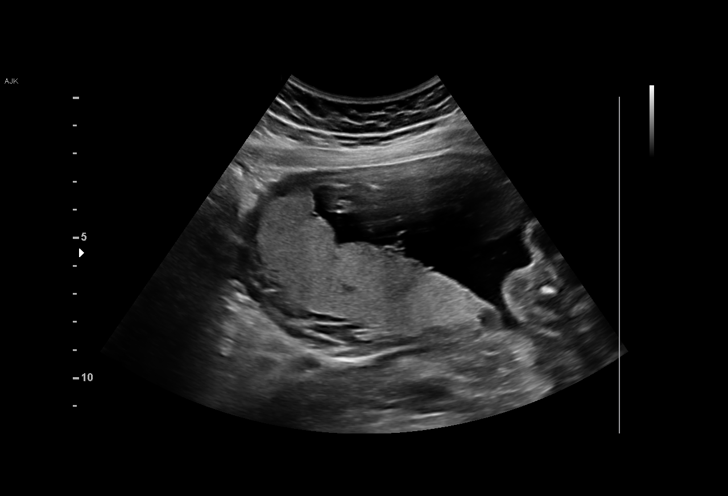
[im 69/81]
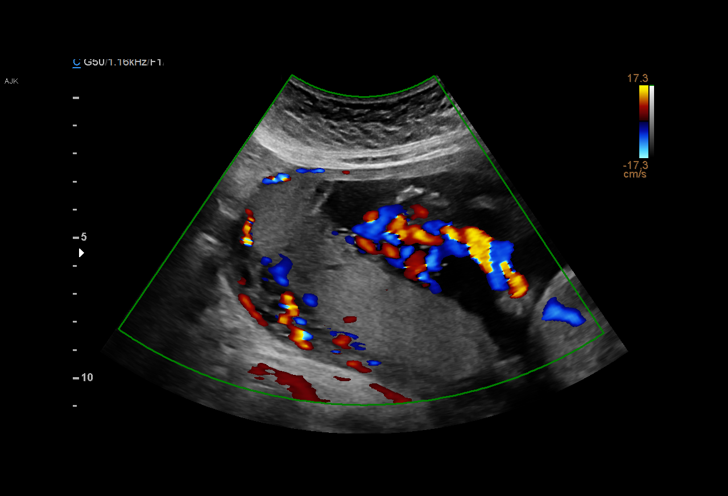
[im 75/81]
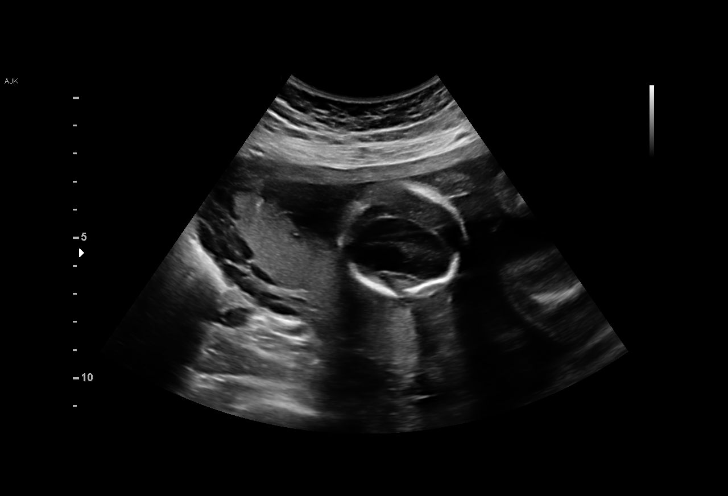
[im 81/81]
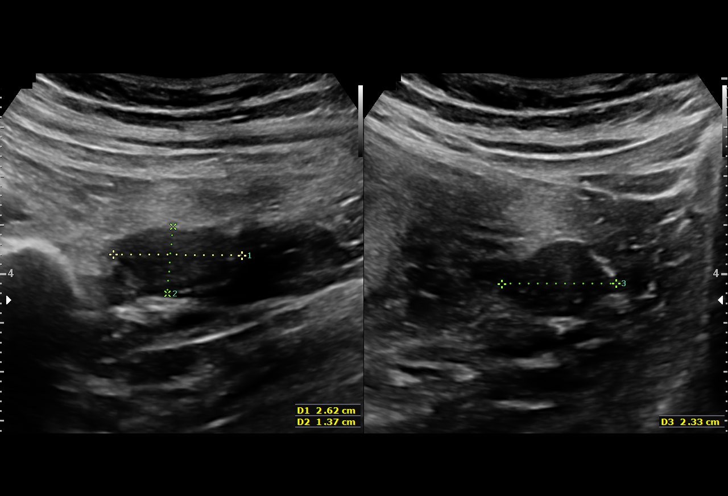

[14 of 28 positions shown; findings below may reference images not displayed]

[REDACTED]

Indications

20 weeks gestation of pregnancy
Encounter for antenatal screening for
malformations
OB History

Blood Type:            Height:  5'4"   Weight (lb):  139       BMI:
Gravidity:    1
Fetal Evaluation

Num Of Fetuses:     1
Fetal Heart         161
Rate(bpm):
Cardiac Activity:   Observed
Presentation:       Transverse, head to maternal right
Placenta:           Posterior
P. Cord Insertion:  Visualized, central

Amniotic Fluid
AFI FV:      Subjectively within normal limits

Largest Pocket(cm)
4.05
Biometry

BPD:      44.9  mm     G. Age:  19w 4d         17  %    CI:        65.21   %    70 - 86
FL/HC:      18.5   %    16.8 -
HC:      178.6  mm     G. Age:  20w 2d         37  %    HC/AC:      1.10        1.09 -
AC:      162.7  mm     G. Age:  21w 3d         73  %    FL/BPD:     73.7   %
FL:       33.1  mm     G. Age:  20w 2d         39  %    FL/AC:      20.3   %    20 - 24
HUM:      32.3  mm     G. Age:  20w 6d         61  %
CER:      21.8  mm     G. Age:  20w 6d         56  %
NFT:       5.7  mm

CM:          4  mm

Est. FW:     377  gm    0 lb 13 oz      53  %
Gestational Age

LMP:           20w 3d        Date:  11/09/17                 EDD:   08/16/18
U/S Today:     20w 3d                                        EDD:   08/16/18
Best:          20w 3d     Det. By:  LMP  (11/09/17)          EDD:   08/16/18
Anatomy

Cranium:               Appears normal         LVOT:                   Appears normal
Cavum:                 Appears normal         Aortic Arch:            Not well visualized
Ventricles:            Appears normal         Ductal Arch:            Not well visualized
Choroid Plexus:        Appears normal         Diaphragm:              Appears normal
Cerebellum:            Appears normal         Stomach:                Appears normal, left
sided
Posterior Fossa:       Appears normal         Abdomen:                Appears normal
Nuchal Fold:           Appears normal         Abdominal Wall:         Appears nml (cord
insert, abd wall)
Face:                  Appears normal         Cord Vessels:           Appears normal (3
(orbits and profile)                           vessel cord)
Lips:                  Appears normal         Kidneys:                Appear normal
Palate:                Not well visualized    Bladder:                Appears normal
Thoracic:              Appears normal         Spine:                  Ltd views
Heart:                 Appears normal         Upper Extremities:      Appears normal
(4CH, axis, and
situs)
RVOT:                  Appears normal         Lower Extremities:      Appears normal

Other:  Parents do not wish to know sex of fetus. Heels, feet, and RT 5th digit
with open hand visualized. Nasal bone visualized. Technically difficult
due to fetal position.
Cervix Uterus Adnexa

Cervix
Length:           4.09  cm.
Normal appearance by transabdominal scan.

Uterus
No abnormality visualized.

Left Ovary
Within normal limits.

Right Ovary
Within normal limits.

Adnexa:       No abnormality visualized.
Impression

We performed fetal anatomy scan. No makers of
aneuploidies or fetal structural defects are seen. Fetal
biometry is consistent with her previously-established dates.
Amniotic fluid is normal and good fetal activity is seen.

On cell-free fetal DNA screening, the risks of fetal
aneuploidies are not increased.

Following structure(s) to be evaluated on her next visit:
-Fetal spine
-Aortic and ductal arches.
Recommendations

An appointment was made for her to return in 4 weeks for
completion of fetal anatomy.
